# Patient Record
Sex: Female | Born: 1990 | Race: Black or African American | Hispanic: No | Marital: Single | Smoking: Former smoker
Health system: Southern US, Community
[De-identification: ages and names within clinical notes are randomized; demographics above are authoritative.]

## PROBLEM LIST (undated history)

## (undated) ENCOUNTER — Inpatient Hospital Stay (HOSPITAL_COMMUNITY): Payer: Self-pay

## (undated) DIAGNOSIS — IMO0002 Reserved for concepts with insufficient information to code with codable children: Secondary | ICD-10-CM

## (undated) DIAGNOSIS — O26899 Other specified pregnancy related conditions, unspecified trimester: Secondary | ICD-10-CM

## (undated) DIAGNOSIS — D649 Anemia, unspecified: Secondary | ICD-10-CM

## (undated) DIAGNOSIS — O0930 Supervision of pregnancy with insufficient antenatal care, unspecified trimester: Secondary | ICD-10-CM

## (undated) DIAGNOSIS — Z6791 Unspecified blood type, Rh negative: Secondary | ICD-10-CM

## (undated) HISTORY — DX: Supervision of pregnancy with insufficient antenatal care, unspecified trimester: O09.30

## (undated) HISTORY — DX: Anemia, unspecified: D64.9

## (undated) HISTORY — DX: Unspecified blood type, rh negative: Z67.91

## (undated) HISTORY — DX: Reserved for concepts with insufficient information to code with codable children: IMO0002

## (undated) HISTORY — DX: Other specified pregnancy related conditions, unspecified trimester: O26.899

---
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
## 2011-01-26 ENCOUNTER — Encounter (HOSPITAL_COMMUNITY): Payer: Self-pay | Admitting: *Deleted

## 2011-01-26 ENCOUNTER — Inpatient Hospital Stay (HOSPITAL_COMMUNITY)
Admission: AD | Admit: 2011-01-26 | Discharge: 2011-01-27 | Disposition: A | Discharge status: Home / Self Care | Payer: Medicaid Other | Source: Ambulatory Visit | Attending: Obstetrics and Gynecology | Admitting: Obstetrics and Gynecology

## 2011-01-26 DIAGNOSIS — O26899 Other specified pregnancy related conditions, unspecified trimester: Secondary | ICD-10-CM

## 2011-01-26 DIAGNOSIS — O99891 Other specified diseases and conditions complicating pregnancy: Secondary | ICD-10-CM | POA: Insufficient documentation

## 2011-01-26 DIAGNOSIS — R109 Unspecified abdominal pain: Secondary | ICD-10-CM | POA: Insufficient documentation

## 2011-01-26 LAB — URINALYSIS, ROUTINE W REFLEX MICROSCOPIC
Glucose, UA: NEGATIVE mg/dL
Hgb urine dipstick: NEGATIVE
Protein, ur: NEGATIVE mg/dL
Specific Gravity, Urine: 1.02 (ref 1.005–1.030)

## 2011-01-26 NOTE — Progress Notes (Signed)
Pt states, " I've had cramping in my low abdomen for a couple of hours."

## 2011-01-27 ENCOUNTER — Inpatient Hospital Stay (HOSPITAL_COMMUNITY): Payer: Medicaid Other

## 2011-01-27 ENCOUNTER — Encounter (HOSPITAL_COMMUNITY): Payer: Self-pay

## 2011-01-27 LAB — WET PREP, GENITAL
Trich, Wet Prep: NONE SEEN
Yeast Wet Prep HPF POC: NONE SEEN

## 2011-01-27 LAB — CBC
HCT: 32.2 % — ABNORMAL LOW (ref 36.0–46.0)
Hemoglobin: 11.6 g/dL — ABNORMAL LOW (ref 12.0–15.0)
MCV: 85.6 fL (ref 78.0–100.0)
RBC: 3.76 MIL/uL — ABNORMAL LOW (ref 3.87–5.11)
WBC: 10.4 10*3/uL (ref 4.0–10.5)

## 2011-01-27 NOTE — Progress Notes (Signed)
Written and verbal d/c instructions given and understanding voiced. 

## 2011-01-27 NOTE — Progress Notes (Signed)
Patient states that she has no prenatal care awaiting for her medicaid. She states that she started sharp lower abdominal pain today and wanted to make sure everything was okay. She denies any vaginal bleeding or discharge.

## 2011-01-27 NOTE — ED Provider Notes (Signed)
Agree with above note.  Heidi Hinton 01/27/2011 7:49 AM   

## 2011-01-27 NOTE — ED Provider Notes (Signed)
History     Chief Complaint  Patient presents with  . Abdominal Pain   HPI This is a 20 y.o. female at [redacted]w[redacted]d (17+ by LMP) who presents with C/O lower abdominal cramping for a week or so. Denies bleeding. Has not had PNC yet, waiting for Medicaid, so she wanted to make sure baby was OK   OB History    Grav Para Term Preterm Abortions TAB SAB Ect Mult Living   1               History reviewed. No pertinent past medical history.  History reviewed. No pertinent past surgical history.  History reviewed. No pertinent family history.  History  Substance Use Topics  . Smoking status: Never Smoker   . Smokeless tobacco: Not on file  . Alcohol Use: No    Allergies: No Known Allergies  Prescriptions prior to admission  Medication Sig Dispense Refill  . Prenatal Vit-Fe Fumarate-FA (PRENATAL MULTIVITAMIN) TABS Take 1 tablet by mouth daily.          ROS See above  Physical Exam   Blood pressure 135/67, pulse 69, temperature 99.1 F (37.3 C), temperature source Oral, resp. rate 16, height 5' 6.25" (1.683 m), weight 112 lb 8 oz (51.03 kg), last menstrual period 09/24/2010.  Physical Exam  Constitutional: She is oriented to person, place, and time. She appears well-developed and well-nourished. No distress.  HENT:  Head: Normocephalic.  Cardiovascular: Normal rate.   Respiratory: Effort normal.  GI: Soft. She exhibits no distension and no mass. There is no tenderness. There is no rebound and no guarding.  Genitourinary: Vagina normal and uterus normal. No vaginal discharge found.       Uterus 12-14 week size Cervix closed and long  Musculoskeletal: Normal range of motion.  Neurological: She is alert and oriented to person, place, and time.  Skin: Skin is warm and dry.  Psychiatric: She has a normal mood and affect.   Ultrasound:: IMPRESSION:  Single live intrauterine pregnancy noted, with a crown-rump length  of 5.6 cm, corresponding to a gestational age of [redacted] weeks 2  days.  This does not match the gestational age by LMP, and reflects a new  estimated date of delivery of August 08, 2011.  12.2 weeks [facility] 08/08/11 MAU Course  Procedures  MDM GC/Chlam/Wet Prep  Assessment and Plan  A:  IUP at [redacted]w[redacted]d      Normal examination P:  Encouraged to seek [facility] soon       D/c home  [facility] 01/27/2011, 2:44 AM

## 2011-01-28 LAB — GC/CHLAMYDIA PROBE AMP, GENITAL: Chlamydia, DNA Probe: NEGATIVE

## 2011-02-05 NOTE — L&D Delivery Note (Signed)
Delivery Note At 6:36 PM a viable female, "Marsell", was delivered via Vaginal, Spontaneous Delivery (Presentation: ;  ).  APGAR: 8, 9; weight .   Placenta status: Intact, Spontaneous.  Cord: 3 vessels with the following complications:  CAN x 1, reduced over vtx at delivery..  Cord pH: NA Cord blood donation collected by V. Emilee Hero, CNM. Uterus moderately boggy after delivery, with several small gushes of blood.  Cytotech 1000 placed in rectum for prophylaxis.  Anesthesia: Epidural  Episiotomy: None Lacerations: Periurethral Suture Repair: 3.0 vicryl rapide Est. Blood Loss (mL): 300 cc  Mom to postpartum.  Baby to skin to skin. Family plans outpatient circumcision.Marland Kitchen  Nigel Bridgeman 08/13/2011, 7:11 PM

## 2011-03-04 LAB — OB RESULTS CONSOLE HEPATITIS B SURFACE ANTIGEN: Hepatitis B Surface Ag: NEGATIVE

## 2011-03-04 LAB — OB RESULTS CONSOLE RUBELLA ANTIBODY, IGM: Rubella: IMMUNE

## 2011-03-04 LAB — OB RESULTS CONSOLE HIV ANTIBODY (ROUTINE TESTING): HIV: NONREACTIVE

## 2011-03-06 ENCOUNTER — Other Ambulatory Visit: Payer: Self-pay | Admitting: Obstetrics and Gynecology

## 2011-03-09 ENCOUNTER — Inpatient Hospital Stay (HOSPITAL_COMMUNITY)
Admission: AD | Admit: 2011-03-09 | Discharge: 2011-03-09 | Disposition: A | Discharge status: Home / Self Care | Payer: Medicaid Other | Source: Ambulatory Visit | Attending: Obstetrics and Gynecology | Admitting: Obstetrics and Gynecology

## 2011-03-09 DIAGNOSIS — Z3480 Encounter for supervision of other normal pregnancy, unspecified trimester: Secondary | ICD-10-CM | POA: Insufficient documentation

## 2011-03-09 DIAGNOSIS — Z2989 Encounter for other specified prophylactic measures: Secondary | ICD-10-CM | POA: Insufficient documentation

## 2011-03-09 DIAGNOSIS — Z298 Encounter for other specified prophylactic measures: Secondary | ICD-10-CM | POA: Insufficient documentation

## 2011-03-09 MED ORDER — RHO D IMMUNE GLOBULIN 1500 UNIT/2ML IJ SOLN
300.0000 ug | Freq: Once | INTRAMUSCULAR | Status: AC
  Administered 2011-03-09: 300 ug via INTRAMUSCULAR
  Filled 2011-03-09: qty 2

## 2011-03-09 NOTE — Progress Notes (Signed)
Pt told to come here for an injection.

## 2011-03-10 LAB — RH IG WORKUP (INCLUDES ABO/RH)

## 2011-03-11 DIAGNOSIS — R87612 Low grade squamous intraepithelial lesion on cytologic smear of cervix (LGSIL): Secondary | ICD-10-CM

## 2011-03-11 DIAGNOSIS — IMO0002 Reserved for concepts with insufficient information to code with codable children: Secondary | ICD-10-CM

## 2011-03-11 HISTORY — DX: Low grade squamous intraepithelial lesion on cytologic smear of cervix (LGSIL): R87.612

## 2011-03-11 HISTORY — DX: Reserved for concepts with insufficient information to code with codable children: IMO0002

## 2011-04-05 DIAGNOSIS — Z6791 Unspecified blood type, Rh negative: Secondary | ICD-10-CM

## 2011-04-05 DIAGNOSIS — O26899 Other specified pregnancy related conditions, unspecified trimester: Secondary | ICD-10-CM

## 2011-04-05 HISTORY — DX: Other specified pregnancy related conditions, unspecified trimester: O26.899

## 2011-04-05 HISTORY — DX: Unspecified blood type, rh negative: Z67.91

## 2011-04-18 ENCOUNTER — Encounter (INDEPENDENT_AMBULATORY_CARE_PROVIDER_SITE_OTHER): Payer: Medicaid Other

## 2011-04-18 DIAGNOSIS — Z331 Pregnant state, incidental: Secondary | ICD-10-CM

## 2011-05-01 ENCOUNTER — Inpatient Hospital Stay (HOSPITAL_COMMUNITY)
Admission: AD | Admit: 2011-05-01 | Discharge: 2011-05-01 | Disposition: A | Discharge status: Home / Self Care | Payer: Medicaid Other | Attending: Obstetrics and Gynecology | Admitting: Obstetrics and Gynecology

## 2011-05-01 ENCOUNTER — Inpatient Hospital Stay (HOSPITAL_COMMUNITY): Payer: Medicaid Other

## 2011-05-01 ENCOUNTER — Encounter (HOSPITAL_COMMUNITY): Payer: Self-pay | Admitting: *Deleted

## 2011-05-01 DIAGNOSIS — O4700 False labor before 37 completed weeks of gestation, unspecified trimester: Secondary | ICD-10-CM | POA: Insufficient documentation

## 2011-05-01 DIAGNOSIS — R109 Unspecified abdominal pain: Secondary | ICD-10-CM

## 2011-05-01 DIAGNOSIS — K529 Noninfective gastroenteritis and colitis, unspecified: Secondary | ICD-10-CM

## 2011-05-01 DIAGNOSIS — O99891 Other specified diseases and conditions complicating pregnancy: Secondary | ICD-10-CM | POA: Insufficient documentation

## 2011-05-01 DIAGNOSIS — Z331 Pregnant state, incidental: Secondary | ICD-10-CM

## 2011-05-01 DIAGNOSIS — K5289 Other specified noninfective gastroenteritis and colitis: Secondary | ICD-10-CM

## 2011-05-01 LAB — URINALYSIS, ROUTINE W REFLEX MICROSCOPIC
Bilirubin Urine: NEGATIVE
Hgb urine dipstick: NEGATIVE
Nitrite: NEGATIVE
Specific Gravity, Urine: <1.005 — ABNORMAL LOW (ref 1.005–1.030)
pH: 6.5 (ref 5.0–8.0)

## 2011-05-01 LAB — WET PREP, GENITAL
Trich, Wet Prep: NONE SEEN
Yeast Wet Prep HPF POC: NONE SEEN

## 2011-05-01 LAB — GC/CHLAMYDIA PROBE AMP, GENITAL: Chlamydia, DNA Probe: NEGATIVE

## 2011-05-01 MED ORDER — PROMETHAZINE HCL 25 MG RE SUPP: 25.0000 mg | Freq: Four times a day (QID) | RECTAL | Status: AC | PRN

## 2011-05-01 MED ORDER — ONDANSETRON 8 MG PO TBDP: 8.0000 mg | ORAL_TABLET | Freq: Three times a day (TID) | ORAL | Status: AC | PRN

## 2011-05-01 MED ORDER — ONDANSETRON 8 MG PO TBDP
8.0000 mg | ORAL_TABLET | Freq: Once | ORAL | Status: AC
  Administered 2011-05-01: 8 mg via ORAL
  Filled 2011-05-01: qty 1

## 2011-05-01 MED ORDER — PROMETHAZINE HCL 25 MG RE SUPP
25.0000 mg | Freq: Once | RECTAL | Status: AC
  Administered 2011-05-01: 25 mg via RECTAL
  Filled 2011-05-01: qty 1
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
## 2011-05-01 NOTE — MAU Note (Signed)
Generalized abdominal pain. No vaginal bleeding or discharge. Vomiting x1 earlier yesterday.

## 2011-05-01 NOTE — Discharge Instructions (Signed)
B.R.A.T. Diet Your doctor has recommended the B.R.A.T. diet for you or your child until the condition improves. This is often used to help control diarrhea and vomiting symptoms. If you or your child can tolerate clear liquids, you may have:  Bananas.   Rice.   Applesauce.   Toast (and other simple starches such as crackers, potatoes, noodles).  Be sure to avoid dairy products, meats, and fatty foods until symptoms are better. Fruit juices such as apple, grape, and prune juice can make diarrhea worse. Avoid these. Continue this diet for 2 days or as instructed by your caregiver. Document Released: 01/21/2005 Document Revised: 01/10/2011 Document Reviewed: 07/10/2006 [facility] Patient Information 2012 [place], [place]. Preventing Preterm Labor Preterm labor is when a pregnant woman has contractions that cause the cervix to open, shorten, and thin before 37 weeks of pregnancy. You will have regular contractions (tightening) 2 to 3 minutes apart. This usually causes discomfort or pain. HOME CARE  Eat a healthy diet.   Take your vitamins as told by your doctor.   Drink enough fluids to keep your pee (urine) clear or pale yellow every day.   Get rest and sleep.   Do not have sex if you are at high risk for preterm labor.   Follow your doctor's advice about activity, medicines, and tests.   Avoid stress.   Avoid hard labor or exercise that lasts for a long time.   Do not smoke.  GET HELP RIGHT AWAY IF:   You are having contractions.   You have belly (abdominal) pain.   You have bleeding from your vagina.   You have pain when you pee (urinate).   You have abnormal discharge from your vagina.   You have a temperature by mouth above 102 F (38.9 C).  MAKE SURE YOU:  Understand these instructions.   Will watch your condition.   Will get help if you are not doing well or get worse.  Document Released: 04/19/2008 Document Revised: 01/10/2011 Document Reviewed:  04/19/2008 [facility] Patient Information 2012 [place], [place].

## 2011-05-17 ENCOUNTER — Encounter: Payer: Medicaid Other | Admitting: Obstetrics and Gynecology

## 2011-05-17 ENCOUNTER — Other Ambulatory Visit: Payer: Medicaid Other

## 2011-05-17 ENCOUNTER — Other Ambulatory Visit: Payer: Self-pay

## 2011-05-22 DIAGNOSIS — R87612 Low grade squamous intraepithelial lesion on cytologic smear of cervix (LGSIL): Secondary | ICD-10-CM

## 2011-05-22 DIAGNOSIS — O26899 Other specified pregnancy related conditions, unspecified trimester: Secondary | ICD-10-CM | POA: Insufficient documentation

## 2011-05-22 DIAGNOSIS — O0930 Supervision of pregnancy with insufficient antenatal care, unspecified trimester: Secondary | ICD-10-CM

## 2011-05-22 DIAGNOSIS — Z6791 Unspecified blood type, Rh negative: Secondary | ICD-10-CM

## 2011-05-22 DIAGNOSIS — IMO0002 Reserved for concepts with insufficient information to code with codable children: Secondary | ICD-10-CM

## 2011-05-22 DIAGNOSIS — D649 Anemia, unspecified: Secondary | ICD-10-CM

## 2011-05-23 ENCOUNTER — Ambulatory Visit (INDEPENDENT_AMBULATORY_CARE_PROVIDER_SITE_OTHER): Payer: Medicaid Other | Admitting: Obstetrics and Gynecology

## 2011-05-23 ENCOUNTER — Encounter: Payer: Self-pay | Admitting: Obstetrics and Gynecology

## 2011-05-23 VITALS — BP 114/56 | Wt 135.0 lb

## 2011-05-23 DIAGNOSIS — Z331 Pregnant state, incidental: Secondary | ICD-10-CM

## 2011-05-23 DIAGNOSIS — N898 Other specified noninflammatory disorders of vagina: Secondary | ICD-10-CM

## 2011-05-23 DIAGNOSIS — R87612 Low grade squamous intraepithelial lesion on cytologic smear of cervix (LGSIL): Secondary | ICD-10-CM

## 2011-05-23 DIAGNOSIS — IMO0002 Reserved for concepts with insufficient information to code with codable children: Secondary | ICD-10-CM

## 2011-05-23 LAB — POCT WET PREP (WET MOUNT)
Clue Cells Wet Prep Whiff POC: POSITIVE
pH: 5.5

## 2011-05-23 LAB — HEMOGLOBIN: Hemoglobin: 10.7 g/dL — ABNORMAL LOW (ref 12.0–15.0)

## 2011-05-23 MED ORDER — METRONIDAZOLE 500 MG PO TABS: 500.0000 mg | ORAL_TABLET | Freq: Two times a day (BID) | ORAL | Status: AC

## 2011-05-23 NOTE — Progress Notes (Addendum)
No complaints Wet  Prep done Colpo done +BV - Flagyl S<D - sched u/s at [facility] repeat pap PP

## 2011-05-23 NOTE — Progress Notes (Addendum)
  HPI  Review of Systems Review of Systems  Blood pressure 114/56, weight 135 lb (61.236 kg), last menstrual period 09/24/2010.  Physical Exam Physical Exam  Genitourinary:       Assessment    Procedure Details  The risks and benefits of the procedure and Written informed consent obtained.  Speculum placed in vagina and excellent visualization of cervix achieved, cervix swabbed x 3 with acetic acid solution.  Specimens: none  Complications: none.     Plan    Specimens labelled and sent to Pathology. Return to discuss Pathology results in 2 weeks.      Purcell Nails 05/23/2011, 5:03 PM

## 2011-05-23 NOTE — Progress Notes (Signed)
Addended by: Osborn Coho on: 05/23/2011 06:14 PM   Modules accepted: Orders

## 2011-05-24 LAB — GLUCOSE TOLERANCE, 1 HOUR: Glucose, 1 Hour GTT: 45 mg/dL — ABNORMAL LOW (ref 70–140)

## 2011-05-30 ENCOUNTER — Encounter: Payer: Self-pay | Admitting: Obstetrics and Gynecology

## 2011-05-30 ENCOUNTER — Ambulatory Visit (INDEPENDENT_AMBULATORY_CARE_PROVIDER_SITE_OTHER): Payer: Medicaid Other | Admitting: Obstetrics and Gynecology

## 2011-05-30 ENCOUNTER — Ambulatory Visit (INDEPENDENT_AMBULATORY_CARE_PROVIDER_SITE_OTHER): Payer: Medicaid Other

## 2011-05-30 VITALS — BP 118/44 | Wt 137.0 lb

## 2011-05-30 DIAGNOSIS — Z331 Pregnant state, incidental: Secondary | ICD-10-CM

## 2011-05-30 NOTE — Progress Notes (Signed)
Doing well. [place]: single gestation, 30 weeks and 4 days (55th percentile), vertex, normal fluid, cervix is 3.24 cm.  Abdominal circumference lags behind by one week. Glucola equals 45.  Syphilis nonreactive.  Hemoglobin 10.7. Repeat ultrasound in 3 weeks. 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  Dr. Stefano Gaul

## 2011-06-03 ENCOUNTER — Encounter: Payer: Medicaid Other | Admitting: Obstetrics and Gynecology

## 2011-06-06 ENCOUNTER — Encounter: Payer: Medicaid Other | Admitting: Obstetrics and Gynecology

## 2011-06-18 ENCOUNTER — Ambulatory Visit (INDEPENDENT_AMBULATORY_CARE_PROVIDER_SITE_OTHER): Payer: Medicaid Other | Admitting: Obstetrics and Gynecology

## 2011-06-18 VITALS — BP 116/60 | Wt 141.0 lb

## 2011-06-18 DIAGNOSIS — Z331 Pregnant state, incidental: Secondary | ICD-10-CM

## 2011-06-18 NOTE — Progress Notes (Signed)
Pt states her ribs have been sore x 3 days.

## 2011-06-18 NOTE — Progress Notes (Signed)
Doing well.  Return office in 2 weeks. Ultrasound next visit because of a lagging abdominal circumference.  Dr. Stefano Gaul

## 2011-06-22 ENCOUNTER — Inpatient Hospital Stay (HOSPITAL_COMMUNITY)
Admission: AD | Admit: 2011-06-22 | Discharge: 2011-06-22 | Disposition: A | Discharge status: Home / Self Care | Payer: Medicaid Other | Source: Ambulatory Visit | Attending: Obstetrics and Gynecology | Admitting: Obstetrics and Gynecology

## 2011-06-22 ENCOUNTER — Encounter (HOSPITAL_COMMUNITY): Payer: Self-pay | Admitting: *Deleted

## 2011-06-22 DIAGNOSIS — O99891 Other specified diseases and conditions complicating pregnancy: Secondary | ICD-10-CM | POA: Insufficient documentation

## 2011-06-22 DIAGNOSIS — D6959 Other secondary thrombocytopenia: Secondary | ICD-10-CM | POA: Diagnosis present

## 2011-06-22 DIAGNOSIS — R079 Chest pain, unspecified: Secondary | ICD-10-CM | POA: Insufficient documentation

## 2011-06-22 DIAGNOSIS — J069 Acute upper respiratory infection, unspecified: Secondary | ICD-10-CM | POA: Diagnosis present

## 2011-06-22 DIAGNOSIS — O4700 False labor before 37 completed weeks of gestation, unspecified trimester: Secondary | ICD-10-CM | POA: Insufficient documentation

## 2011-06-22 LAB — DIFFERENTIAL
Basophils Absolute: 0 10*3/uL (ref 0.0–0.1)
Basophils Relative: 0 % (ref 0–1)
Eosinophils Absolute: 0.1 10*3/uL (ref 0.0–0.7)
Monocytes Absolute: 1 10*3/uL (ref 0.1–1.0)
Neutro Abs: 9.4 10*3/uL — ABNORMAL HIGH (ref 1.7–7.7)
Neutrophils Relative %: 79 % — ABNORMAL HIGH (ref 43–77)

## 2011-06-22 LAB — CBC
HCT: 31.1 % — ABNORMAL LOW (ref 36.0–46.0)
Hemoglobin: 11.2 g/dL — ABNORMAL LOW (ref 12.0–15.0)
MCH: 31.8 pg (ref 26.0–34.0)
MCHC: 36 g/dL (ref 30.0–36.0)
RDW: 13.5 % (ref 11.5–15.5)

## 2011-06-22 LAB — WET PREP, GENITAL
Clue Cells Wet Prep HPF POC: NONE SEEN
Trich, Wet Prep: NONE SEEN
Yeast Wet Prep HPF POC: NONE SEEN

## 2011-06-22 LAB — URINALYSIS, ROUTINE W REFLEX MICROSCOPIC
Bilirubin Urine: NEGATIVE
Hgb urine dipstick: NEGATIVE
Ketones, ur: 40 mg/dL — AB
Protein, ur: NEGATIVE mg/dL
Urobilinogen, UA: 0.2 mg/dL (ref 0.0–1.0)

## 2011-06-22 MED ORDER — GUAIFENESIN-CODEINE 100-10 MG/5ML PO SOLN
10.0000 mL | ORAL | Status: AC
  Administered 2011-06-22: 10 mL via ORAL
  Filled 2011-06-22: qty 10

## 2011-06-22 MED ORDER — ALBUTEROL SULFATE (5 MG/ML) 0.5% IN NEBU
INHALATION_SOLUTION | RESPIRATORY_TRACT | Status: AC
  Filled 2011-06-22: qty 0.5

## 2011-06-22 MED ORDER — ALBUTEROL SULFATE (5 MG/ML) 0.5% IN NEBU
2.5000 mg | INHALATION_SOLUTION | RESPIRATORY_TRACT | Status: AC
  Administered 2011-06-22: 22:00:00 via RESPIRATORY_TRACT

## 2011-06-22 MED ORDER — GUAIFENESIN-CODEINE 100-10 MG/5ML PO SYRP: 5.0000 mL | ORAL_SOLUTION | Freq: Three times a day (TID) | ORAL | Status: AC | PRN

## 2011-06-22 MED ORDER — RHO D IMMUNE GLOBULIN 1500 UNIT/2ML IJ SOLN
300.0000 ug | Freq: Once | INTRAMUSCULAR | Status: AC
  Administered 2011-06-22: 300 ug via INTRAMUSCULAR

## 2011-06-22 MED ORDER — AZITHROMYCIN 250 MG PO TABS: ORAL_TABLET | ORAL | Status: AC

## 2011-06-22 MED ORDER — AZITHROMYCIN 250 MG PO TABS
500.0000 mg | ORAL_TABLET | ORAL | Status: AC
  Administered 2011-06-22: 500 mg via ORAL
  Filled 2011-06-22: qty 2

## 2011-06-22 MED ORDER — IPRATROPIUM BROMIDE 0.02 % IN SOLN
RESPIRATORY_TRACT | Status: AC
  Filled 2011-06-22: qty 2.5

## 2011-06-22 MED ORDER — ALBUTEROL SULFATE HFA 108 (90 BASE) MCG/ACT IN AERS
1.0000 | INHALATION_SPRAY | RESPIRATORY_TRACT | Status: DC | PRN
  Administered 2011-06-22: 1 via RESPIRATORY_TRACT
  Filled 2011-06-22 (×2): qty 6.7

## 2011-06-22 MED ORDER — IPRATROPIUM BROMIDE 0.02 % IN SOLN
0.5000 mg | RESPIRATORY_TRACT | Status: AC
  Administered 2011-06-22: 22:00:00 via RESPIRATORY_TRACT

## 2011-06-22 NOTE — MAU Note (Signed)
Pt states feels better and is breathing better. Take home inhaler in to pt and use explained and understanding voiced

## 2011-06-22 NOTE — Progress Notes (Signed)
Heidi Hinton CNM in to. GBS also obtained.

## 2011-06-22 NOTE — Progress Notes (Signed)
Written and verbal d/c instructions given and understanding voiced. 

## 2011-06-22 NOTE — MAU Note (Signed)
Respiratory at bedside for breathing tx

## 2011-06-22 NOTE — MAU Provider Note (Signed)
History   21 yo G1P0 at 75 2/7 weeks presented unannounced c/o chest tightness, wheezing, productive cough, and nasal congestion x 24 hours.  Best friend had cold and cough, and patient was exposed to her over the last 24 hours.  Patient denies fever, dysuria, hemoptysis, or other symptoms.  Also reports she has not received her 28 week Rhophylac (received it in early pregnancy for 1st trimester bleeding).  Does report occasional cramping since she has had cough, denies leaking, bleeding, and reports +FM.  Pregnancy remarkable for: Late to care Anemia Rh negative LGSIL on pap AC growth lag--being followed by [place] at [facility].   Chief Complaint  Patient presents with  . Abdominal Pain  . Pleurisy    OB History    Grav Para Term Preterm Abortions TAB SAB Ect Mult Living   1               Past Medical History  Diagnosis Date  . Anemia   . Rh negative status during pregnancy 04/2011  . 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 Wisdom tooth extraction 2009    Family History  Problem Relation Age of Onset  . Alcohol abuse Mother   . Asthma Brother   . Cancer Brother   . Mental illness Maternal Grandmother   . Cancer Maternal Grandmother   . Anesthesia problems Neg Hx   . Hypotension Neg Hx   . Malignant hyperthermia Neg Hx   . Pseudochol deficiency Neg Hx     History  Substance Use Topics  . Smoking status: Never Smoker   . Smokeless tobacco: Never Used  . Alcohol Use: No    Allergies: No Known Allergies  Prescriptions prior to admission  Medication Sig Dispense Refill  . acetaminophen (TYLENOL) 325 MG tablet Take 650 mg by mouth every 6 (six) hours as needed. pain      . FeFum-FePoly-FA-B Cmp-C-Biot (INTEGRA PLUS) CAPS Take by mouth daily.      . Prenatal Vit-Fe Fumarate-FA (PRENATAL MULTIVITAMIN) TABS Take 1 tablet by mouth every morning.          Physical Exam   Blood pressure 134/79,  pulse 87, temperature 98.5 F (36.9 C), temperature source Oral, resp. rate 20, height 5\' 7"  (1.702 m), weight 141 lb (63.957 kg), last menstrual period 09/24/2010.  In NAD, but audible wheezing Chest inspiratory and expiratory wheezes in upper lobes.  Clear phlegm with coughing. Heart RRR without murmur Abd gravid, NT Pelvic--deferred at present Ext WNL Negative CVAT  FHR reactive, no decels Sporadic mild contractions, generally associated with coughing episodes  ED Course  IUP at 33 2/7 weeks URI, with reactive airway changes Rh negative, has not had Rhophylac Uterine activity  Plan: Breathing treatment Initiate Zpak, cough syrup--1st doses now. Check CBC, diff, Rhophylac w/u and injection. Albuterol inhaler at discharge.  Nigel Bridgeman, CNM, [place] 06/22/11 9:45p   Addendum: Doing much better--no SOB, wheezing resolved.  Patient has coughed up a large amount of yellowish sputum. Sporadic mild contractions, patient unaware of these. FHR reactive, no decels.  Pelvic--cervix closed, long, vtx -2 Small amount white d/c in vault. GC, chlamydia, GBS, wet prep done. Awaiting Rhophylac, CBC/diff results.  Plan: D/C home after Rhophylac, labs returned. Rx completion of Zpak, Robitussin AC. Home with albuterol inhaler and instructions. Push po fluids, and rest. S/S PTL reviewed. Keep scheduled appointment at [facility] 5/28 or call prn.  Nigel Bridgeman, CNM,  [place] 06/22/11  11p

## 2011-06-22 NOTE — MAU Note (Signed)
Pt up to BR. Ok to d/c efm per V. Emilee Hero, CNM.

## 2011-06-22 NOTE — Discharge Instructions (Signed)

## 2011-06-22 NOTE — MAU Note (Signed)
Awoke our of sleep at 0400 with chest congestion and trying to catch my breath. Was seen by Dr Blima Rich with sore ribs. Took damp cloth, warmed in microwave and put on chest to break congestion but didn't help. At 1000 was wheezing some. Fan blowing on me helped. Chest tight. Hurts to cough. Coughs up clear mucous.

## 2011-06-23 LAB — RH IG WORKUP (INCLUDES ABO/RH): Antibody Screen: NEGATIVE

## 2011-06-27 ENCOUNTER — Encounter: Payer: Self-pay | Admitting: Obstetrics and Gynecology

## 2011-06-27 DIAGNOSIS — O99820 Streptococcus B carrier state complicating pregnancy: Secondary | ICD-10-CM | POA: Insufficient documentation

## 2011-06-29 LAB — CULTURE, BETA STREP (GROUP B ONLY)

## 2011-07-02 ENCOUNTER — Encounter: Payer: Medicaid Other | Admitting: Obstetrics and Gynecology

## 2011-07-02 ENCOUNTER — Ambulatory Visit (INDEPENDENT_AMBULATORY_CARE_PROVIDER_SITE_OTHER): Payer: Medicaid Other

## 2011-07-02 ENCOUNTER — Ambulatory Visit (INDEPENDENT_AMBULATORY_CARE_PROVIDER_SITE_OTHER): Payer: Medicaid Other | Admitting: Obstetrics and Gynecology

## 2011-07-02 VITALS — BP 118/56 | Wt 144.0 lb

## 2011-07-02 DIAGNOSIS — O26879 Cervical shortening, unspecified trimester: Secondary | ICD-10-CM

## 2011-07-02 DIAGNOSIS — Z331 Pregnant state, incidental: Secondary | ICD-10-CM

## 2011-07-02 DIAGNOSIS — O26849 Uterine size-date discrepancy, unspecified trimester: Secondary | ICD-10-CM

## 2011-07-02 LAB — US OB FOLLOW UP

## 2011-07-02 NOTE — Progress Notes (Signed)
RTO 2 weeks. Ultrasound: Single gestation, normal fluid, vertex, normal anatomy, cervix 3. To 2 cm, 34 weeks and 3 days (36 percentile), abdominal circumference 46 percentile. Doing well.

## 2011-07-17 ENCOUNTER — Ambulatory Visit (INDEPENDENT_AMBULATORY_CARE_PROVIDER_SITE_OTHER): Payer: Medicaid Other | Admitting: Obstetrics and Gynecology

## 2011-07-17 VITALS — BP 106/72 | Wt 147.0 lb

## 2011-07-17 DIAGNOSIS — Z331 Pregnant state, incidental: Secondary | ICD-10-CM

## 2011-07-17 NOTE — Progress Notes (Signed)
Pt states she has no concerns. GBS today. Pt states she cannot give a urine sample at this time. Will need to leave one before she leaves. Pt desires cervix check today.  [facility] CMA

## 2011-07-17 NOTE — Progress Notes (Signed)
Beta strep, GC, Chlamydia sent. Return office in one week. AVS

## 2011-07-18 LAB — GC/CHLAMYDIA PROBE AMP, GENITAL
Chlamydia, DNA Probe: NEGATIVE
GC Probe Amp, Genital: NEGATIVE

## 2011-07-19 LAB — STREP B DNA PROBE: GBSP: POSITIVE

## 2011-07-24 ENCOUNTER — Encounter (HOSPITAL_COMMUNITY): Payer: Self-pay

## 2011-07-24 ENCOUNTER — Other Ambulatory Visit: Payer: Medicaid Other

## 2011-07-24 ENCOUNTER — Encounter: Payer: Medicaid Other | Admitting: Obstetrics and Gynecology

## 2011-07-24 ENCOUNTER — Inpatient Hospital Stay (HOSPITAL_COMMUNITY): Payer: Medicaid Other

## 2011-07-24 ENCOUNTER — Inpatient Hospital Stay (HOSPITAL_COMMUNITY)
Admission: AD | Admit: 2011-07-24 | Discharge: 2011-07-24 | Disposition: A | Discharge status: Home / Self Care | Payer: Medicaid Other | Source: Ambulatory Visit | Attending: Obstetrics and Gynecology | Admitting: Obstetrics and Gynecology

## 2011-07-24 DIAGNOSIS — O99820 Streptococcus B carrier state complicating pregnancy: Secondary | ICD-10-CM

## 2011-07-24 DIAGNOSIS — Z91199 Patient's noncompliance with other medical treatment and regimen due to unspecified reason: Secondary | ICD-10-CM

## 2011-07-24 DIAGNOSIS — D696 Thrombocytopenia, unspecified: Secondary | ICD-10-CM

## 2011-07-24 DIAGNOSIS — Z9119 Patient's noncompliance with other medical treatment and regimen: Secondary | ICD-10-CM

## 2011-07-24 DIAGNOSIS — J069 Acute upper respiratory infection, unspecified: Secondary | ICD-10-CM

## 2011-07-24 DIAGNOSIS — O99891 Other specified diseases and conditions complicating pregnancy: Secondary | ICD-10-CM | POA: Insufficient documentation
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
## 2011-07-24 NOTE — Progress Notes (Signed)
Heidi Hinton CNM NOTIFIED OF PATIENT INTENT TO LEAVE AGAINST MEDICAL ADVICE. PATIENT DOES DENY ANY PAIN, REPORTS GOOD FETAL MOVEMENT. PATIENT STATES,"I CAN'T STAY UP HERE FOR 4 HOURS, I FEEL FINE AND MY BABY IS MOVING A LOT"

## 2011-07-26 ENCOUNTER — Telehealth: Payer: Self-pay | Admitting: Obstetrics and Gynecology

## 2011-07-26 NOTE — Telephone Encounter (Signed)
PER JAMIE, AWAITING WORD BACK FROM THE MD'S ABOUT APPTS, WAS TOLD TO SEND THIS MSG BACK TO YOU UNTIL SHE GETS AN ANSWER ABOUT WHERE TO SCHED THIS PT.

## 2011-07-30 ENCOUNTER — Ambulatory Visit (INDEPENDENT_AMBULATORY_CARE_PROVIDER_SITE_OTHER): Payer: Medicaid Other | Admitting: Obstetrics and Gynecology

## 2011-07-30 VITALS — BP 108/70 | Wt 151.0 lb

## 2011-07-30 DIAGNOSIS — O26849 Uterine size-date discrepancy, unspecified trimester: Secondary | ICD-10-CM

## 2011-07-30 DIAGNOSIS — Z331 Pregnant state, incidental: Secondary | ICD-10-CM

## 2011-07-30 NOTE — Progress Notes (Signed)
Positive beta strep.  Treatment during labor discussed. Ultrasound next visit because of size less than dates. Return office in one week. Dr. Stefano Gaul
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
## 2011-08-06 ENCOUNTER — Other Ambulatory Visit: Payer: Self-pay | Admitting: Obstetrics and Gynecology

## 2011-08-06 DIAGNOSIS — IMO0002 Reserved for concepts with insufficient information to code with codable children: Secondary | ICD-10-CM

## 2011-08-07 ENCOUNTER — Ambulatory Visit (INDEPENDENT_AMBULATORY_CARE_PROVIDER_SITE_OTHER): Payer: Medicaid Other | Admitting: Obstetrics and Gynecology

## 2011-08-07 ENCOUNTER — Other Ambulatory Visit: Payer: Self-pay | Admitting: Obstetrics and Gynecology

## 2011-08-07 ENCOUNTER — Ambulatory Visit (INDEPENDENT_AMBULATORY_CARE_PROVIDER_SITE_OTHER): Payer: Medicaid Other

## 2011-08-07 ENCOUNTER — Encounter: Payer: Self-pay | Admitting: Obstetrics and Gynecology

## 2011-08-07 DIAGNOSIS — O26849 Uterine size-date discrepancy, unspecified trimester: Secondary | ICD-10-CM

## 2011-08-07 DIAGNOSIS — IMO0002 Reserved for concepts with insufficient information to code with codable children: Secondary | ICD-10-CM

## 2011-08-07 NOTE — Progress Notes (Signed)
Pt c/o contractions about 10 min apart last night. Hasn't had any this morning. Request cervix check.

## 2011-08-07 NOTE — Progress Notes (Signed)
Induction discussed.  Risk and benefits reviewed. Ultrasound today for size less than dates. Ultrasound: Single gestation, vertex, normal fluid, 7 lbs. 4 oz. (27th percentile) Return to office 1 week. Dr. Stefano Gaul

## 2011-08-09 ENCOUNTER — Telehealth (HOSPITAL_COMMUNITY): Payer: Self-pay | Admitting: *Deleted

## 2011-08-09 ENCOUNTER — Encounter (HOSPITAL_COMMUNITY): Payer: Self-pay | Admitting: *Deleted

## 2011-08-09 NOTE — Telephone Encounter (Signed)
Preadmission screen  

## 2011-08-12 ENCOUNTER — Telehealth: Payer: Self-pay | Admitting: Obstetrics and Gynecology

## 2011-08-12 ENCOUNTER — Ambulatory Visit (INDEPENDENT_AMBULATORY_CARE_PROVIDER_SITE_OTHER): Payer: Self-pay | Admitting: Obstetrics and Gynecology

## 2011-08-12 ENCOUNTER — Encounter: Payer: Medicaid Other | Admitting: Obstetrics and Gynecology

## 2011-08-12 ENCOUNTER — Encounter: Payer: Self-pay | Admitting: Obstetrics and Gynecology

## 2011-08-12 VITALS — BP 120/82 | Wt 155.0 lb

## 2011-08-12 DIAGNOSIS — Z3490 Encounter for supervision of normal pregnancy, unspecified, unspecified trimester: Secondary | ICD-10-CM

## 2011-08-12 DIAGNOSIS — Z331 Pregnant state, incidental: Secondary | ICD-10-CM

## 2011-08-12 LAB — US OB FOLLOW UP

## 2011-08-12 NOTE — Telephone Encounter (Signed)
Pt called complaining of persistent contractions every 10 min since 2:00 am with pain and pressure. Pt was given ov @ 10:15 today with CNM Denise. Quinn Axe

## 2011-08-12 NOTE — Progress Notes (Signed)
GA: [redacted]w[redacted]d,G1P0, had been c/o CTX all night and had been walking around to relieve them. Irreg CTX. SVE: 2/50/-2 posterior, Medium, no labor. Labor precautions given. To keep scheduled appt 08/15/11

## 2011-08-12 NOTE — Progress Notes (Signed)
Contractions since last night 10 mins apart expelling mucous plug

## 2011-08-13 ENCOUNTER — Inpatient Hospital Stay (HOSPITAL_COMMUNITY)
Admission: AD | Admit: 2011-08-13 | Discharge: 2011-08-15 | DRG: 775 | Disposition: A | Discharge status: Home / Self Care | Payer: Medicaid Other | Source: Ambulatory Visit | Attending: Obstetrics and Gynecology | Admitting: Obstetrics and Gynecology

## 2011-08-13 ENCOUNTER — Encounter (HOSPITAL_COMMUNITY): Payer: Self-pay | Admitting: Anesthesiology

## 2011-08-13 ENCOUNTER — Encounter (HOSPITAL_COMMUNITY): Payer: Self-pay | Admitting: *Deleted

## 2011-08-13 ENCOUNTER — Inpatient Hospital Stay (HOSPITAL_COMMUNITY): Payer: Medicaid Other | Admitting: Anesthesiology

## 2011-08-13 DIAGNOSIS — O99892 Other specified diseases and conditions complicating childbirth: Secondary | ICD-10-CM | POA: Diagnosis present

## 2011-08-13 DIAGNOSIS — Z22330 Carrier of Group B streptococcus: Secondary | ICD-10-CM

## 2011-08-13 DIAGNOSIS — IMO0001 Reserved for inherently not codable concepts without codable children: Secondary | ICD-10-CM | POA: Insufficient documentation

## 2011-08-13 LAB — URINALYSIS, ROUTINE W REFLEX MICROSCOPIC
Bilirubin Urine: NEGATIVE
Nitrite: NEGATIVE
Specific Gravity, Urine: 1.01 (ref 1.005–1.030)
Urobilinogen, UA: 0.2 mg/dL (ref 0.0–1.0)

## 2011-08-13 LAB — COMPREHENSIVE METABOLIC PANEL
Albumin: 3.2 g/dL — ABNORMAL LOW (ref 3.5–5.2)
BUN: 5 mg/dL — ABNORMAL LOW (ref 6–23)
Calcium: 9.2 mg/dL (ref 8.4–10.5)
Creatinine, Ser: 0.66 mg/dL (ref 0.50–1.10)
Total Bilirubin: 0.4 mg/dL (ref 0.3–1.2)
Total Protein: 6.4 g/dL (ref 6.0–8.3)

## 2011-08-13 LAB — CBC
MCH: 31.1 pg (ref 26.0–34.0)
MCHC: 35 g/dL (ref 30.0–36.0)
Platelets: 157 10*3/uL (ref 150–400)
RBC: 3.95 MIL/uL (ref 3.87–5.11)

## 2011-08-13 LAB — URINE MICROSCOPIC-ADD ON

## 2011-08-13 LAB — LACTATE DEHYDROGENASE: LDH: 224 U/L (ref 94–250)

## 2011-08-13 LAB — URIC ACID: Uric Acid, Serum: 6.3 mg/dL (ref 2.4–7.0)

## 2011-08-13 LAB — RPR: RPR Ser Ql: NONREACTIVE

## 2011-08-13 MED ORDER — LANOLIN HYDROUS EX OINT: TOPICAL_OINTMENT | CUTANEOUS | Status: DC | PRN

## 2011-08-13 MED ORDER — PENICILLIN G POTASSIUM 5000000 UNITS IJ SOLR
2.5000 10*6.[IU] | INTRAVENOUS | Status: DC
  Administered 2011-08-13 (×2): 2.5 10*6.[IU] via INTRAVENOUS
  Filled 2011-08-13 (×5): qty 2.5

## 2011-08-13 MED ORDER — EPHEDRINE 5 MG/ML INJ: 10.0000 mg | INTRAVENOUS | Status: DC | PRN

## 2011-08-13 MED ORDER — OXYCODONE-ACETAMINOPHEN 5-325 MG PO TABS
1.0000 | ORAL_TABLET | ORAL | Status: DC | PRN
  Administered 2011-08-14: 2 via ORAL
  Administered 2011-08-14: 1 via ORAL
  Administered 2011-08-15 (×2): 2 via ORAL
  Filled 2011-08-13 (×3): qty 2
  Filled 2011-08-13: qty 1

## 2011-08-13 MED ORDER — DIPHENHYDRAMINE HCL 50 MG/ML IJ SOLN: 12.5000 mg | INTRAMUSCULAR | Status: DC | PRN

## 2011-08-13 MED ORDER — PHENYLEPHRINE 40 MCG/ML (10ML) SYRINGE FOR IV PUSH (FOR BLOOD PRESSURE SUPPORT): 80.0000 ug | PREFILLED_SYRINGE | INTRAVENOUS | Status: DC | PRN

## 2011-08-13 MED ORDER — LACTATED RINGERS IV SOLN
INTRAVENOUS | Status: DC
  Administered 2011-08-13 (×4): via INTRAVENOUS

## 2011-08-13 MED ORDER — IBUPROFEN 600 MG PO TABS
600.0000 mg | ORAL_TABLET | Freq: Four times a day (QID) | ORAL | Status: DC | PRN
  Administered 2011-08-13: 600 mg via ORAL
  Filled 2011-08-13: qty 1

## 2011-08-13 MED ORDER — SIMETHICONE 80 MG PO CHEW: 80.0000 mg | CHEWABLE_TABLET | ORAL | Status: DC | PRN

## 2011-08-13 MED ORDER — DIPHENHYDRAMINE HCL 25 MG PO CAPS: 25.0000 mg | ORAL_CAPSULE | Freq: Four times a day (QID) | ORAL | Status: DC | PRN

## 2011-08-13 MED ORDER — PENICILLIN G POTASSIUM 5000000 UNITS IJ SOLR
5.0000 10*6.[IU] | Freq: Once | INTRAVENOUS | Status: AC
  Administered 2011-08-13: 5 10*6.[IU] via INTRAVENOUS
  Filled 2011-08-13: qty 5

## 2011-08-13 MED ORDER — OXYTOCIN BOLUS FROM INFUSION
250.0000 mL | Freq: Once | INTRAVENOUS | Status: DC
  Filled 2011-08-13: qty 500

## 2011-08-13 MED ORDER — IBUPROFEN 600 MG PO TABS
600.0000 mg | ORAL_TABLET | Freq: Four times a day (QID) | ORAL | Status: DC
  Administered 2011-08-14 – 2011-08-15 (×7): 600 mg via ORAL
  Filled 2011-08-13 (×7): qty 1

## 2011-08-13 MED ORDER — BENZOCAINE-MENTHOL 20-0.5 % EX AERO
1.0000 "application " | INHALATION_SPRAY | CUTANEOUS | Status: DC | PRN
  Administered 2011-08-13: 1 via TOPICAL
  Filled 2011-08-13: qty 56

## 2011-08-13 MED ORDER — EPHEDRINE 5 MG/ML INJ
10.0000 mg | INTRAVENOUS | Status: DC | PRN
  Filled 2011-08-13: qty 4

## 2011-08-13 MED ORDER — DIBUCAINE 1 % RE OINT: 1.0000 "application " | TOPICAL_OINTMENT | RECTAL | Status: DC | PRN

## 2011-08-13 MED ORDER — MISOPROSTOL 200 MCG PO TABS
ORAL_TABLET | ORAL | Status: AC
  Administered 2011-08-13: 1000 ug via RECTAL
  Filled 2011-08-13: qty 5

## 2011-08-13 MED ORDER — ONDANSETRON HCL 4 MG PO TABS: 4.0000 mg | ORAL_TABLET | ORAL | Status: DC | PRN

## 2011-08-13 MED ORDER — PRENATAL MULTIVITAMIN CH
1.0000 | ORAL_TABLET | Freq: Every day | ORAL | Status: DC
  Administered 2011-08-14 – 2011-08-15 (×2): 1 via ORAL
  Filled 2011-08-13 (×2): qty 1

## 2011-08-13 MED ORDER — SENNOSIDES-DOCUSATE SODIUM 8.6-50 MG PO TABS
2.0000 | ORAL_TABLET | Freq: Every day | ORAL | Status: DC
  Administered 2011-08-13 – 2011-08-14 (×2): 2 via ORAL

## 2011-08-13 MED ORDER — FLEET ENEMA 7-19 GM/118ML RE ENEM: 1.0000 | ENEMA | RECTAL | Status: DC | PRN

## 2011-08-13 MED ORDER — PHENYLEPHRINE 40 MCG/ML (10ML) SYRINGE FOR IV PUSH (FOR BLOOD PRESSURE SUPPORT)
80.0000 ug | PREFILLED_SYRINGE | INTRAVENOUS | Status: DC | PRN
  Filled 2011-08-13: qty 5

## 2011-08-13 MED ORDER — LACTATED RINGERS IV SOLN
INTRAVENOUS | Status: DC
  Administered 2011-08-13: 14:00:00 via INTRAUTERINE

## 2011-08-13 MED ORDER — TERBUTALINE SULFATE 1 MG/ML IJ SOLN
INTRAMUSCULAR | Status: AC
  Administered 2011-08-13: 0.25 mg via SUBCUTANEOUS
  Filled 2011-08-13: qty 1

## 2011-08-13 MED ORDER — LACTATED RINGERS IV SOLN
500.0000 mL | INTRAVENOUS | Status: DC | PRN
  Administered 2011-08-13 (×2): 500 mL via INTRAVENOUS

## 2011-08-13 MED ORDER — ONDANSETRON HCL 4 MG/2ML IJ SOLN
4.0000 mg | Freq: Four times a day (QID) | INTRAMUSCULAR | Status: DC | PRN
  Administered 2011-08-13: 4 mg via INTRAVENOUS
  Filled 2011-08-13: qty 2

## 2011-08-13 MED ORDER — WITCH HAZEL-GLYCERIN EX PADS: 1.0000 "application " | MEDICATED_PAD | CUTANEOUS | Status: DC | PRN

## 2011-08-13 MED ORDER — ACETAMINOPHEN 325 MG PO TABS: 650.0000 mg | ORAL_TABLET | ORAL | Status: DC | PRN

## 2011-08-13 MED ORDER — ZOLPIDEM TARTRATE 5 MG PO TABS: 5.0000 mg | ORAL_TABLET | Freq: Every evening | ORAL | Status: DC | PRN

## 2011-08-13 MED ORDER — LIDOCAINE HCL (PF) 1 % IJ SOLN
30.0000 mL | INTRAMUSCULAR | Status: DC | PRN
  Filled 2011-08-13: qty 30

## 2011-08-13 MED ORDER — TETANUS-DIPHTH-ACELL PERTUSSIS 5-2.5-18.5 LF-MCG/0.5 IM SUSP: 0.5000 mL | Freq: Once | INTRAMUSCULAR | Status: DC

## 2011-08-13 MED ORDER — OXYCODONE-ACETAMINOPHEN 5-325 MG PO TABS: 1.0000 | ORAL_TABLET | ORAL | Status: DC | PRN

## 2011-08-13 MED ORDER — LACTATED RINGERS IV SOLN
500.0000 mL | Freq: Once | INTRAVENOUS | Status: AC
  Administered 2011-08-13: 500 mL via INTRAVENOUS

## 2011-08-13 MED ORDER — FENTANYL 2.5 MCG/ML BUPIVACAINE 1/10 % EPIDURAL INFUSION (WH - ANES)
14.0000 mL/h | INTRAMUSCULAR | Status: DC
  Administered 2011-08-13 (×2): 14 mL/h via EPIDURAL
  Filled 2011-08-13 (×3): qty 60

## 2011-08-13 MED ORDER — ONDANSETRON HCL 4 MG/2ML IJ SOLN: 4.0000 mg | INTRAMUSCULAR | Status: DC | PRN

## 2011-08-13 MED ORDER — CITRIC ACID-SODIUM CITRATE 334-500 MG/5ML PO SOLN: 30.0000 mL | ORAL | Status: DC | PRN

## 2011-08-13 MED ORDER — OXYTOCIN 40 UNITS IN LACTATED RINGERS INFUSION - SIMPLE MED
62.5000 mL/h | Freq: Once | INTRAVENOUS | Status: AC
  Administered 2011-08-13: 62.5 mL/h via INTRAVENOUS
  Filled 2011-08-13: qty 1000

## 2011-08-13 MED ORDER — FENTANYL 2.5 MCG/ML BUPIVACAINE 1/10 % EPIDURAL INFUSION (WH - ANES)
INTRAMUSCULAR | Status: DC | PRN
  Administered 2011-08-13: 14 mL/h via EPIDURAL

## 2011-08-13 MED ORDER — SODIUM BICARBONATE 8.4 % IV SOLN
INTRAVENOUS | Status: DC | PRN
  Administered 2011-08-13: 4 mL via EPIDURAL

## 2011-08-13 NOTE — Progress Notes (Signed)
  Subjective: Comfortable with epidural.  FOB and patient's mother visiting.  Objective: BP 125/70  Pulse 64  Temp 98.3 F (36.8 C) (Oral)  Resp 18  Ht 5\' 8"  (1.727 m)  Wt 152 lb (68.947 kg)  BMI 23.11 kg/m2  SpO2 99%  LMP 09/24/2010      FHT:  Category 1, some early decels UC:   regular, every 6 minutes, 2 min duration of contractions SVE:   Dilation: 5 Effacement (%): 80 Station: -1 Exam by:: Manfred Arch CNM AROM--moderate MSF noted. IUPC placed.  Labs: Lab Results  Component Value Date   WBC 13.2* 08/13/2011   HGB 12.3 08/13/2011   HCT 35.1* 08/13/2011   MCV 88.9 08/13/2011   PLT 157 08/13/2011    Assessment / Plan: Progressive labor Will observe adequacy of contractions s/p AROM--augment prn   Magdaline Zollars 08/13/2011, 1:21 PM

## 2011-08-13 NOTE — Progress Notes (Signed)
  Subjective: Comfortable with epidural.  No pressure.  Objective: BP 131/70  Pulse 96  Temp 98.5 F (36.9 C) (Oral)  Resp 16  Ht 5\' 8"  (1.727 m)  Wt 152 lb (68.947 kg)  BMI 23.11 kg/m2  SpO2 99%  LMP 09/24/2010      FHT: Reassuring, sporadic mild variables/early decels UC:   q 3-4 min, MVUs 160 SVE:   Dilation: 10 Effacement (%): 80 Station: +1 Exam by:: Manfred Arch CNM No descent with single trial push.  Labs: Lab Results  Component Value Date   WBC 13.2* 08/13/2011   HGB 12.3 08/13/2011   HCT 35.1* 08/13/2011   MCV 88.9 08/13/2011   PLT 157 08/13/2011    Assessment / Plan: 2nd stage labor No urge to push Will allow to labor down, then initiate active pushing with urge.   Nigel Bridgeman 08/13/2011, 5:12 PM

## 2011-08-13 NOTE — H&P (Signed)
Heidi Hinton is a 21 y.o. female presenting for uc with mucus to spots of blood when using the bathroom, no srom, ha last pm, denies daily headaches,  visual spots or blurring, epigastric pain, or edema, with +FM. Ready for epidural  History OB History    Grav Para Term Preterm Abortions TAB SAB Ect Mult Living   1 0 0 0 0 0 0 0 0 0      Past Medical History  Diagnosis Date  . Anemia   . Rh negative status during pregnancy 04/2011  . LSIL (low grade squamous intraepithelial lesion) on Pap smear 03/11/2011  . Late prenatal care    Past Surgical History  Procedure Date  . Wisdom tooth extraction 2009   Family History: family history includes Alcohol abuse in her mother; Alzheimer's disease in her maternal grandmother; Asthma in her brother; Cancer in her brother and maternal grandmother; and Mental illness in her maternal grandmother.  There is no history of Anesthesia problems, and Hypotension, and Malignant hyperthermia, and Pseudochol deficiency, . Social History:  reports that she has never smoked. She has never used smokeless tobacco. She reports that she does not drink alcohol or use illicit drugs.   Prenatal Transfer Tool  Maternal Diabetes: No Genetic Screening: Declined Maternal Ultrasounds/Referrals: Normal Fetal Ultrasounds or other Referrals:  None Maternal Substance Abuse:  No Significant Maternal Medications:  None Significant Maternal Lab Results:  Lab values include: Group B Strep positive, Rh negative Other Comments:  None  ROS  Dilation: 3 Effacement (%): 100 Station: -1 Exam by:: Heidi Hinton CNM Blood pressure 143/68, pulse 64, temperature 98.4 F (36.9 C), temperature source Oral, resp. rate 20, height 5\' 8"  (1.727 m), weight 152 lb (68.947 kg), last menstrual period 09/24/2010. Exam Physical Exam Calm, no distress, HEENT WNL,  lungs clear bilaterally, AP RRR, abd soft nt,no masses, not tympanic bowel sounds active, abdomen nontender, Normal hair  distrubition mons pubis,  EGBUS WNL,  sterile speculum exam with scant tan discharge vagina pink, moist normal rugae,  Vag 3 100 -1/0 VTX Intact {edema to lower extremities, DTRS +2 bilaterally no clonus fhts category 1 uc q 3-5 mild to mod  Prenatal labs: ABO, Rh: --/--/O NEG (05/18 2200) Antibody: NEG (05/18 2200) Rubella: Immune (01/28 0000) RPR: NON REAC (04/18 1548)  HBsAg: Negative (01/28 0000)  HIV: Non-reactive (01/28 0000)  GBS: POSITIVE (06/12 1606)   Assessment/Plan: 40 57 week IUP early labor  elevated bp  GBS+  Routine admission, Pen G, epidural, PIH labs, collaboration with Dr. Stefano Hinton per telephone. Heidi Hinton 08/13/2011, 7:11 AM

## 2011-08-13 NOTE — Progress Notes (Signed)
  Subjective: CTSP for spontaneous decel during episode of spontaneous tachysystole, following cycle of early decels and variables.  Amnioinfusion begun almost at the time of onset of tachysystole.  Patient feeling some pressure in her vagina. O2 on.   Objective: BP 142/73  Pulse 91  Temp 98 F (36.7 C) (Oral)  Resp 20  Ht 5\' 8"  (1.727 m)  Wt 152 lb (68.947 kg)  BMI 23.11 kg/m2  SpO2 99%  LMP 09/24/2010      FHT: Decel to 70-90 during 5 minutes of tachysystole.  FSE applied.  Terbutaline 0.25 mg SQ given.  Dr. Normand Sloop in.  UC:  Tachysystole, then resolved to irregular contractions.  MVUs prior to episode 85-120, not on any augmentation.  Currently, FHR Category 1 with moderate variability, no decels. UCs q 2-4 min.  SVE:   Dilation: 5 Effacement (%): 80 Station: -1 Exam by:: Manfred Arch CNM  Assessment / Plan: Decel with spontaneous tachysystole--now resolved. Will CTO at present. Dr. Pennie Rushing updated.    Nigel Bridgeman 08/13/2011, 2:40 PM

## 2011-08-13 NOTE — Anesthesia Procedure Notes (Signed)

## 2011-08-13 NOTE — MAU Note (Signed)
Contractions every 2-10 mins apart. Light vaginal bleeding. Seen yesterday was 3cm.

## 2011-08-13 NOTE — Anesthesia Preprocedure Evaluation (Signed)

## 2011-08-13 NOTE — Progress Notes (Signed)
Subjective: Breathing with contractions, female support person at bedside.  Desires epidural.  Objective: BP 146/80  Pulse 69  Temp 98.3 F (36.8 C) (Oral)  Resp 20  Ht 5\' 8"  (1.727 m)  Wt 152 lb (68.947 kg)  BMI 23.11 kg/m2  LMP 09/24/2010      FHT:  Category 1 UC:   irregular, every 4-8nutes SVE:   Dilation: 4 Effacement (%): 80 Station: -1 Exam by:: Manfred Arch CNM  Labs: Lab Results  Component Value Date   WBC 13.2* 08/13/2011   HGB 12.3 08/13/2011   HCT 35.1* 08/13/2011   MCV 88.9 08/13/2011   PLT 157 08/13/2011    Assessment / Plan: Early labor Will place epidural, then plan AROM after patient comfortable.    Nigel Bridgeman 08/13/2011, 9:34 AM

## 2011-08-14 LAB — CBC
HCT: 22.5 % — ABNORMAL LOW (ref 36.0–46.0)
Hemoglobin: 7.9 g/dL — ABNORMAL LOW (ref 12.0–15.0)
MCH: 31.1 pg (ref 26.0–34.0)
MCHC: 35.1 g/dL (ref 30.0–36.0)
MCV: 88.6 fL (ref 78.0–100.0)
RBC: 2.54 MIL/uL — ABNORMAL LOW (ref 3.87–5.11)

## 2011-08-14 MED ORDER — POLYSACCHARIDE IRON COMPLEX 150 MG PO CAPS
150.0000 mg | ORAL_CAPSULE | Freq: Two times a day (BID) | ORAL | Status: DC
  Administered 2011-08-14 – 2011-08-15 (×3): 150 mg via ORAL
  Filled 2011-08-14 (×5): qty 1

## 2011-08-14 MED ORDER — RHO D IMMUNE GLOBULIN 1500 UNIT/2ML IJ SOLN
300.0000 ug | Freq: Once | INTRAMUSCULAR | Status: AC
  Administered 2011-08-14: 300 ug via INTRAMUSCULAR
  Filled 2011-08-14: qty 2

## 2011-08-14 NOTE — Progress Notes (Signed)
Patient ID: Heidi Hinton, female   DOB: [birth year], 21 y.o.   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  Just returned to pt's room now to round, and female holding newborn and pt not in room and he states he doesn't know where she went.  Will come back by to round later.

## 2011-08-14 NOTE — Anesthesia Postprocedure Evaluation (Signed)
  Anesthesia Post-op Note  

## 2011-08-14 NOTE — Progress Notes (Signed)
UR chart review completed.  

## 2011-08-14 NOTE — Anesthesia Postprocedure Evaluation (Signed)
  Anesthesia Post-op Note  Patient: Heidi Hinton  Procedure(s) Performed: * No surgery found *  Patient Location: Mother/Baby  Anesthesia Type: Epidural  Level of Consciousness: awake  Airway and Oxygen Therapy: Patient Spontanous Breathing  Post-op Pain: none  Post-op Assessment: Patient's Cardiovascular Status Stable, Respiratory Function Stable, Patent Airway, No signs of Nausea or vomiting, Adequate PO intake, Pain level controlled, No headache and No residual motor weakness  Post-op Vital Signs: Reviewed and stable  Complications: No apparent anesthesia complications

## 2011-08-15 ENCOUNTER — Encounter: Payer: Self-pay | Admitting: Obstetrics and Gynecology

## 2011-08-15 LAB — RH IG WORKUP (INCLUDES ABO/RH)
Antibody Screen: POSITIVE
DAT, IgG: NEGATIVE
Fetal Screen: NEGATIVE
Unit division: 0

## 2011-08-15 MED ORDER — IBUPROFEN 600 MG PO TABS: 600.0000 mg | ORAL_TABLET | Freq: Four times a day (QID) | ORAL | Status: AC

## 2011-08-15 MED ORDER — MEDROXYPROGESTERONE ACETATE 150 MG/ML IM SUSP
150.0000 mg | Freq: Once | INTRAMUSCULAR | Status: AC
  Administered 2011-08-15: 150 mg via INTRAMUSCULAR
  Filled 2011-08-15: qty 1

## 2011-08-15 MED ORDER — POLYSACCHARIDE IRON COMPLEX 150 MG PO CAPS: 150.0000 mg | ORAL_CAPSULE | Freq: Two times a day (BID) | ORAL | Status: DC

## 2011-08-22 NOTE — Discharge Summary (Signed)
Obstetric Discharge Summary Reason for Admission: onset of labor Prenatal Procedures: ultrasound Intrapartum Procedures: spontaneous vaginal delivery, GBS prophylaxis and epdiural, IUPC, amnioinfusion,  Postpartum Procedures: cytotec PR immediately after del of placenta for boggy uterus, Rhogam  Complications-Operative and Postpartum: periurethral laceration, anemia, infant w elevated bilirubin    Hemoglobin  Date Value Range Status  08/14/2011 7.9* 12.0 - 15.0 g/dL Final     DELTA CHECK NOTED     REPEATED TO VERIFY     HCT  Date Value Range Status  08/14/2011 22.5* 36.0 - 46.0 % Final    Hospital Course:  Hospital Course: Admitted in labor. BP was noted to be borderline elevated. PIH labs were normal, and UA was neg.  pos GBS. Pt rcv'd PCN per protocol.  Pt rcvd epidural, BP normalized. AROM for MSAF was done, IUPC placed, FHR decels w spont tachysystole and FSE placed. Amnioinfusion was begun. Progressed to fully dilated, and pt labored down, before actively pushing.  Delivery was performed by V.Emilee Hero, CNM.  without difficulty. Pt did receive cytotec PR after delivery of placenta for slightly boggy uterus. Patient and baby tolerated the procedure without difficulty, with a periurethral  laceration noted. Infant to FTN. Mother and infant then had an uncomplicated postpartum course, w BP's remaining normal and with breast feeding going well. Orthostatic VSS, pt declined a Blood tx. Infant did have an elevated bilirubin and was on double phototherapy, infant remained in FTN/tho at [facility] w mom. on d/c day. Mom's physical exam was WNL, and she was discharged home in stable condition. Contraception plan was depo-provera.  She received adequate benefit from po pain medications.  Discharge Diagnoses: Term Pregnancy-delivered, anemia, RH neg mother, RH pos baby   Discharge Information: Date: 08/22/2011 Activity: pelvic rest Diet: routine and iron - rich Medications:  Medication  List  As of 08/22/2011  2:30 PM   START taking these medications         ibuprofen 600 MG tablet   Commonly known as: ADVIL,MOTRIN   Take 1 tablet (600 mg total) by mouth every 6 (six) hours.      iron polysaccharides 150 MG capsule   Commonly known as: NIFEREX   Take 1 capsule (150 mg total) by mouth 2 (two) times daily.         CONTINUE taking these medications         prenatal multivitamin Tabs          Where to get your medications    These are the prescriptions that you need to pick up. We sent them to a specific pharmacy, so you will need to go there to get them.   [id number] - [place], [place] - [address] AT [facility] OF [address]    [address] [place] [address]    Phone: [phone]        ibuprofen 600 MG tablet   iron polysaccharides 150 MG capsule           Condition: stable Instructions: refer to practice specific booklet Discharge to: home Follow-up Information    Follow up with [facility] in 6 weeks. (call to make appointment for pap smear and colposcopy with MD )          Newborn Data: Live born  Information for the patient's newborn:  Heidi Hinton [[id number]]  female ; APGAR , 8, 9 ; weight ; 6#5oz  Infant remains as "baby" patient in FTN/at BS w  mom to receive phototherapy  Heidi Hinton 08/22/2011, 2:30 PM

## 2011-09-26 ENCOUNTER — Ambulatory Visit: Payer: Medicaid Other | Admitting: Obstetrics and Gynecology

## 2011-09-30 ENCOUNTER — Ambulatory Visit: Payer: Medicaid Other | Admitting: Obstetrics and Gynecology

## 2011-10-03 ENCOUNTER — Encounter: Payer: Self-pay | Admitting: Obstetrics and Gynecology

## 2011-10-03 ENCOUNTER — Ambulatory Visit (INDEPENDENT_AMBULATORY_CARE_PROVIDER_SITE_OTHER): Payer: Medicaid Other | Admitting: Obstetrics and Gynecology

## 2011-10-03 VITALS — BP 110/80 | Resp 20 | Wt 129.5 lb

## 2011-10-03 DIAGNOSIS — Z3049 Encounter for surveillance of other contraceptives: Secondary | ICD-10-CM

## 2011-10-03 DIAGNOSIS — D509 Iron deficiency anemia, unspecified: Secondary | ICD-10-CM

## 2011-10-03 DIAGNOSIS — E611 Iron deficiency: Secondary | ICD-10-CM

## 2011-10-03 DIAGNOSIS — Z3042 Encounter for surveillance of injectable contraceptive: Secondary | ICD-10-CM

## 2011-10-03 DIAGNOSIS — R87612 Low grade squamous intraepithelial lesion on cytologic smear of cervix (LGSIL): Secondary | ICD-10-CM

## 2011-10-03 NOTE — Progress Notes (Signed)
Heidi Hinton is a 21 y.o. female who presents for a postpartum visit.   Patient reports doing well.   Received Depo on day of d/c, wants to continue.  Needs Rx. No pp depression. Had increased bleeding immediately pp, with Hgb 7.7 pp. Needs pap today.  Hx remarkable for: Patient Active Problem List  Diagnosis  . Late prenatal care  . Anemia  . LSIL (low grade squamous intraepithelial lesion) on Pap smear  . Rh negative status during pregnancy  . URI (upper respiratory infection)  . Thrombocytopenia  . GBS (group B Streptococcus carrier), +RV culture, currently pregnant  . Small for gestational age  . Vaginal delivery  . Elevated blood pressure      PPDS = 2  Contraception plan:  Depo Provera--received on d/c from hospital.   I have fully reviewed the prenatal and intrapartum course   Patient has not been sexually active since delivery.   The following portions of the patient's history were reviewed and updated as appropriate: allergies, current medications, past family history, past medical history, past social history, past surgical history and problem list.  Review of Systems Pertinent items are noted in HPI.   Objective:    BP 110/80  Resp 20  Wt 129 lb 8 oz (58.741 kg)  LMP 09/24/2010  Breastfeeding? Yes  General:  alert, cooperative and no distress     Lungs: clear to auscultation bilaterally  Heart:  regular rate and rhythm, S1, S2 normal, no murmur  Abdomen: soft, non-tender; bowel sounds normal; no masses,  no organomegaly   Vulva:  normal  Vagina: normal vagina  Cervix:  normal  Uterus: normal size, contour, position, consistency, mobility, non-tender, well-involuted  Adnexa:  normal adnexa             Assessment:     Normal postpartum exam.  Pap smear done at today's visit.   Plan:  Follow-up for Depo in October--annual in 1 year.. Rxs:  Depo Provera 150 mg IM q 12 weeks x 1 year. Next dose due by 11/11/11.  Patient will call  office to schedule Depo as time approaches.  CBC today Pap today--will f/u regarding results.  Nigel Bridgeman CNM, [place] 10/03/2011 4:17 PM

## 2011-10-03 NOTE — Progress Notes (Addendum)
Date of delivery: 08/23/11 Female Name: Heidi Hinton Vaginal delivery:yes Cesarean section:no Tubal ligation:no GDM:no Breast Feeding:yes Bottle Feeding:yes Post-Partum Blues:no Abnormal pap:yes LSIL 03/21/11 PAP DUE TODAY  Normal GU function: yes Normal GI function:yes Returning to work:yes EPDS: 2  PT HAS DEPO

## 2011-10-04 LAB — CBC
HCT: 32.7 % — ABNORMAL LOW (ref 36.0–46.0)
MCH: 27.6 pg (ref 26.0–34.0)
MCHC: 33 g/dL (ref 30.0–36.0)
MCV: 83.4 fL (ref 78.0–100.0)
Platelets: 213 10*3/uL (ref 150–400)
RDW: 14.7 % (ref 11.5–15.5)
WBC: 6 10*3/uL (ref 4.0–10.5)

## 2011-10-08 LAB — PAP IG W/ RFLX HPV ASCU

## 2011-11-06 ENCOUNTER — Telehealth: Payer: Self-pay | Admitting: Obstetrics and Gynecology

## 2011-11-06 ENCOUNTER — Other Ambulatory Visit: Payer: Medicaid Other

## 2012-07-07 ENCOUNTER — Encounter (HOSPITAL_COMMUNITY): Payer: Self-pay | Admitting: Emergency Medicine

## 2012-07-07 ENCOUNTER — Emergency Department (HOSPITAL_COMMUNITY)
Admission: EM | Admit: 2012-07-07 | Discharge: 2012-07-07 | Disposition: A | Discharge status: Home / Self Care | Payer: Self-pay | Attending: Emergency Medicine | Admitting: Emergency Medicine

## 2012-07-07 DIAGNOSIS — Z862 Personal history of diseases of the blood and blood-forming organs and certain disorders involving the immune mechanism: Secondary | ICD-10-CM | POA: Insufficient documentation

## 2012-07-07 DIAGNOSIS — K0889 Other specified disorders of teeth and supporting structures: Secondary | ICD-10-CM

## 2012-07-07 DIAGNOSIS — K089 Disorder of teeth and supporting structures, unspecified: Secondary | ICD-10-CM | POA: Insufficient documentation

## 2012-07-07 DIAGNOSIS — Z8742 Personal history of other diseases of the female genital tract: Secondary | ICD-10-CM | POA: Insufficient documentation

## 2012-07-07 DIAGNOSIS — Z79899 Other long term (current) drug therapy: Secondary | ICD-10-CM | POA: Insufficient documentation

## 2012-07-07 MED ORDER — PENICILLIN V POTASSIUM 250 MG PO TABS
500.0000 mg | ORAL_TABLET | Freq: Once | ORAL | Status: AC
  Administered 2012-07-07: 500 mg via ORAL
  Filled 2012-07-07: qty 2

## 2012-07-07 MED ORDER — HYDROCODONE-ACETAMINOPHEN 5-325 MG PO TABS: 1.0000 | ORAL_TABLET | Freq: Once | ORAL | Status: DC

## 2012-07-07 MED ORDER — HYDROCODONE-ACETAMINOPHEN 5-325 MG PO TABS: 1.0000 | ORAL_TABLET | ORAL | Status: DC | PRN

## 2012-07-07 MED ORDER — HYDROCODONE-ACETAMINOPHEN 5-325 MG PO TABS
1.0000 | ORAL_TABLET | Freq: Once | ORAL | Status: AC
  Administered 2012-07-07: 1 via ORAL
  Filled 2012-07-07: qty 1

## 2012-07-07 MED ORDER — PENICILLIN V POTASSIUM 500 MG PO TABS: 500.0000 mg | ORAL_TABLET | Freq: Three times a day (TID) | ORAL | Status: DC

## 2012-07-07 NOTE — ED Notes (Addendum)
PT. REPORTS PERSISTENT LEFT UPPER MOLAR PAIN FOR SEVERAL MONTHS ( FILLING CAME OUT) .

## 2012-07-07 NOTE — ED Provider Notes (Signed)
Medical screening examination/treatment/procedure(s) were performed by non-physician practitioner and as supervising physician I was immediately available for consultation/collaboration.  Kong Packett M Hillery Bhalla, MD 07/07/12 0729 

## 2012-07-07 NOTE — ED Provider Notes (Signed)
History     CSN: [id number]  Arrival date & time 07/07/12  0309   None     Chief Complaint  Patient presents with  . Dental Pain    (Consider location/radiation/quality/duration/timing/severity/associated sxs/prior treatment) HPI History provided by pt.   Patient's filling in L upper molar fell out during her pregnancy last year.  Has had intermittent pain since then, but usually resolves w/ advil.  Over the past three days, pain has been more severe and refractory to OTC analgesics.  Radiates to entire L side of face.  No associated fever.    Past Medical History  Diagnosis Date  . Anemia   . Rh negative status during pregnancy 04/2011  . LSIL (low grade squamous intraepithelial lesion) on Pap smear 03/11/2011  . Late prenatal care     Past Surgical History  Procedure Laterality Date  . Wisdom tooth extraction  2009    Family History  Problem Relation Age of Onset  . Alcohol abuse Mother   . Asthma Brother   . Cancer Brother     osteosarcoma  . Cancer Maternal Grandmother   . Mental illness Maternal Grandmother   . Alzheimer's disease Maternal Grandmother   . Anesthesia problems Neg Hx   . Hypotension Neg Hx   . Malignant hyperthermia Neg Hx   . Pseudochol deficiency Neg Hx     History  Substance Use Topics  . Smoking status: Never Smoker   . Smokeless tobacco: Never Used  . Alcohol Use: No    OB History   Grav Para Term Preterm Abortions TAB SAB Ect Mult Living   1 1 1  0 0 0 0 0 0 1      Review of Systems  All other systems reviewed and are negative.    Allergies  Review of patient's allergies indicates no known allergies.  Home Medications   Current Outpatient Rx  Name  Route  Sig  Dispense  Refill  . Aspirin-Acetaminophen-Caffeine (GOODY HEADACHE PO)   Oral   Take 1 packet by mouth every 6 (six) hours as needed (pain).         . naproxen sodium (ANAPROX) 220 MG tablet   Oral   Take 220 mg by mouth 2 (two) times daily as needed (pain).          Marland Kitchen HYDROcodone-acetaminophen (NORCO/VICODIN) 5-325 MG per tablet   Oral   Take 1 tablet by mouth every 4 (four) hours as needed for pain.   15 tablet   0   . medroxyPROGESTERone (DEPO-PROVERA) 150 MG/ML injection   Intramuscular   Inject 150 mg into the muscle every 3 (three) months.         . penicillin v potassium (VEETID) 500 MG tablet   Oral   Take 1 tablet (500 mg total) by mouth 3 (three) times daily.   30 tablet   0     BP 134/75  Pulse 58  Temp(Src) 98.4 F (36.9 C) (Oral)  Resp 14  SpO2 100%  LMP 06/29/2012  Physical Exam  Nursing note and vitals reviewed. Constitutional: She is oriented to person, place, and time. She appears well-developed and well-nourished. No distress.  HENT:  Head: Normocephalic and atraumatic.  L upper first molar w/ filling.  Part of filling has fallen out.  Tooth mildly ttp.  Mild, diffuse gingivitis.  No edema of buccal mucosa. Otherwise unremarkable dentition.    Eyes:  Normal appearance  Neck: Normal range of motion. Neck supple.  Cardiovascular: Normal  rate and regular rhythm.   Pulmonary/Chest: Effort normal and breath sounds normal. No respiratory distress.  Musculoskeletal: Normal range of motion.  Lymphadenopathy:    She has cervical adenopathy.  Neurological: She is alert and oriented to person, place, and time.  Skin: Skin is warm and dry. No rash noted.  Psychiatric: She has a normal mood and affect. Her behavior is normal.    ED Course  Procedures (including critical care time)  Labs Reviewed - No data to display No results found.   1. Toothache       MDM  22yo F presents w/ toothache.  Possible periapical abscess L upper first molar.  Abx and 15 vicodin prescribed.  Referred to dentist on call.  Pt aware that she should schedule appt w/in 48 hours.  Return precautions discussed.         Otilio Miu, PA-C 07/07/12 0601  Otilio Miu, PA-C 07/07/12 [phone]

## 2012-12-29 ENCOUNTER — Inpatient Hospital Stay (HOSPITAL_COMMUNITY): Payer: Medicaid Other

## 2012-12-29 ENCOUNTER — Inpatient Hospital Stay (HOSPITAL_COMMUNITY)
Admission: AD | Admit: 2012-12-29 | Discharge: 2012-12-29 | Disposition: A | Discharge status: Home / Self Care | Payer: Medicaid Other | Source: Ambulatory Visit | Attending: Family Medicine | Admitting: Family Medicine

## 2012-12-29 ENCOUNTER — Encounter (HOSPITAL_COMMUNITY): Payer: Self-pay | Admitting: *Deleted

## 2012-12-29 DIAGNOSIS — R109 Unspecified abdominal pain: Secondary | ICD-10-CM | POA: Insufficient documentation

## 2012-12-29 DIAGNOSIS — R112 Nausea with vomiting, unspecified: Secondary | ICD-10-CM

## 2012-12-29 DIAGNOSIS — O219 Vomiting of pregnancy, unspecified: Secondary | ICD-10-CM

## 2012-12-29 DIAGNOSIS — O99891 Other specified diseases and conditions complicating pregnancy: Secondary | ICD-10-CM | POA: Insufficient documentation

## 2012-12-29 DIAGNOSIS — O210 Mild hyperemesis gravidarum: Secondary | ICD-10-CM | POA: Insufficient documentation

## 2012-12-29 LAB — CBC
Platelets: 146 10*3/uL — ABNORMAL LOW (ref 150–400)
RBC: 3.75 MIL/uL — ABNORMAL LOW (ref 3.87–5.11)
RDW: 13.4 % (ref 11.5–15.5)
WBC: 7.9 10*3/uL (ref 4.0–10.5)

## 2012-12-29 LAB — URINALYSIS, ROUTINE W REFLEX MICROSCOPIC
Hgb urine dipstick: NEGATIVE
Nitrite: NEGATIVE
Specific Gravity, Urine: 1.01 (ref 1.005–1.030)
Urobilinogen, UA: 2 mg/dL — ABNORMAL HIGH (ref 0.0–1.0)
pH: 8.5 — ABNORMAL HIGH (ref 5.0–8.0)

## 2012-12-29 LAB — WET PREP, GENITAL
Trich, Wet Prep: NONE SEEN
Yeast Wet Prep HPF POC: NONE SEEN

## 2012-12-29 LAB — POCT PREGNANCY, URINE: Preg Test, Ur: POSITIVE — AB

## 2012-12-29 MED ORDER — ONDANSETRON 4 MG PO TBDP
4.0000 mg | ORAL_TABLET | Freq: Once | ORAL | Status: AC
  Administered 2012-12-29: 4 mg via ORAL
  Filled 2012-12-29: qty 1

## 2012-12-29 MED ORDER — ONDANSETRON 4 MG PO TBDP: 4.0000 mg | ORAL_TABLET | Freq: Three times a day (TID) | ORAL | Status: DC | PRN

## 2012-12-29 NOTE — MAU Note (Signed)
Patient states she has not had Depo for about one year. Has not had periods. States her child and she have had nausea, vomiting and diarrhea for the past 3 weeks but now thinks she might be pregnant. Diarrhea stopped last week. Has had abdominal pain for about 3 weeks. Denies bleeding or discharge.

## 2012-12-29 NOTE — MAU Provider Note (Signed)
Chart reviewed and agree with management and plan.  

## 2012-12-29 NOTE — MAU Provider Note (Signed)
Subjective:  Heidi Hinton is a 22 yof G2P1001 at unknown gestation who presents today with a 2wk hx of nausea, vomiting and abd pain.  She references the pain as being periumbilical and constant.  She states her nausea has been constant over the past two wks and she has not taken any medication to improve her symptoms.  She also reports having diarrhea x2wks. She denies fever, chills, vaginal discharge/bleeding, dysuria.  She states there is a possibility she could be pregnant today.   Pt states her LMP was prior to having her first son, who is now 18 months.     Patient Active Problem List   Diagnosis Date Noted  . Depo-Provera contraceptive status 10/03/2011  . Elevated blood pressure 08/22/2011  . Vaginal delivery 08/13/2011  . Small for gestational age 75/04/2011  . GBS (group B Streptococcus carrier), +RV culture, currently pregnant 06/27/2011  . URI (upper respiratory infection) 06/22/2011  . Thrombocytopenia 06/22/2011  . Late prenatal care 05/22/2011  . Anemia 05/22/2011  . Rh negative status during pregnancy 05/22/2011  . LSIL (low grade squamous intraepithelial lesion) on Pap smear 03/11/2011   Past Medical History  Diagnosis Date  . Anemia   . Rh negative status during pregnancy 04/2011  . LSIL (low grade squamous intraepithelial lesion) on Pap smear 03/11/2011  . Late prenatal care     Past Surgical History  Procedure Laterality Date  . Wisdom tooth extraction  2009    Prescriptions prior to admission  Medication Sig Dispense Refill  . Aspirin-Acetaminophen-Caffeine (GOODY HEADACHE PO) Take 1 packet by mouth every 6 (six) hours as needed (pain).      . Phenylephrine-DM-GG (MUCINEX CONGEST & COUGH CHILD) 2.5-5-100 MG/5ML LIQD Take 10 mLs by mouth daily as needed (For cold symtpoms.).       No Known Allergies  History  Substance Use Topics  . Smoking status: Never Smoker   . Smokeless tobacco: Never Used  . Alcohol Use: No    Family History  Problem Relation Age of  Onset  . Alcohol abuse Mother   . Asthma Brother   . Cancer Brother     osteosarcoma  . Cancer Maternal Grandmother   . Mental illness Maternal Grandmother   . Alzheimer's disease Maternal Grandmother   . Anesthesia problems Neg Hx   . Hypotension Neg Hx   . Malignant hyperthermia Neg Hx   . Pseudochol deficiency Neg Hx     Results for orders placed during the hospital encounter of 12/29/12 (from the past 24 hour(s))  URINALYSIS, ROUTINE W REFLEX MICROSCOPIC     Status: Abnormal   Collection Time    12/29/12 11:15 AM      Result Value Range   Color, Urine YELLOW  YELLOW   APPearance HAZY (*) CLEAR   Specific Gravity, Urine 1.010  1.005 - 1.030   pH 8.5 (*) 5.0 - 8.0   Glucose, UA NEGATIVE  NEGATIVE mg/dL   Hgb urine dipstick NEGATIVE  NEGATIVE   Bilirubin Urine NEGATIVE  NEGATIVE   Ketones, ur NEGATIVE  NEGATIVE mg/dL   Protein, ur NEGATIVE  NEGATIVE mg/dL   Urobilinogen, UA 2.0 (*) 0.0 - 1.0 mg/dL   Nitrite NEGATIVE  NEGATIVE   Leukocytes, UA NEGATIVE  NEGATIVE  POCT PREGNANCY, URINE     Status: Abnormal   Collection Time    12/29/12 11:38 AM      Result Value Range   Preg Test, Ur POSITIVE (*) NEGATIVE  HCG, QUANTITATIVE, PREGNANCY  Status: Abnormal   Collection Time    12/29/12 12:07 PM      Result Value Range   hCG, Beta Chain, Heidi Hinton [id number] (*) <5 mIU/mL  CBC     Status: Abnormal   Collection Time    12/29/12 12:10 PM      Result Value Range   WBC 7.9  4.0 - 10.5 K/uL   RBC 3.75 (*) 3.87 - 5.11 MIL/uL   Hemoglobin 11.4 (*) 12.0 - 15.0 g/dL   HCT 19.1 (*) 47.8 - 29.5 %   MCV 84.3  78.0 - 100.0 fL   MCH 30.4  26.0 - 34.0 pg   MCHC 36.1 (*) 30.0 - 36.0 g/dL   RDW 62.1  30.8 - 65.7 %   Platelets 146 (*) 150 - 400 K/uL  WET PREP, GENITAL     Status: Abnormal   Collection Time    12/29/12 12:24 PM      Result Value Range   Yeast Wet Prep HPF POC NONE SEEN  NONE SEEN   Trich, Wet Prep NONE SEEN  NONE SEEN   Clue Cells Wet Prep HPF POC FEW (*) NONE SEEN    WBC, Wet Prep HPF POC FEW (*) NONE SEEN    Review of Systems Pertinent items are noted in HPI.  Objective:   Patient Vitals for the past 8 hrs:  BP Temp Temp src Pulse Resp SpO2 Height Weight  12/29/12 1111 130/75 mmHg 98.1 F (36.7 C) Oral 87 16 100 % 5\' 7"  (1.702 m) 112 lb 9.6 oz (51.075 kg)   BP 130/75  Pulse 87  Temp(Src) 98.1 F (36.7 C) (Oral)  Resp 16  Ht 5\' 7"  (1.702 m)  Wt 112 lb 9.6 oz (51.075 kg)  BMI 17.63 kg/m2  SpO2 100%  LMP 06/29/2012 General appearance: alert, cooperative, mild distress and from discomfort of nausea Abdomen: normal findings: bowel sounds normal, no masses palpable and no organomegaly and abnormal findings:  mild tenderness in the periumbilical area and umbilical hernia Pelvic: cervix normal in appearance, external genitalia normal, no adnexal masses or tenderness, no cervical motion tenderness, uterus normal size, shape, and consistency and . Thick, white, non-odorous discharge noted.   Skin: Skin color, texture, turgor normal. No rashes or lesions   Assessment:  R/O ectopic pregnancy Pregnancy induced nausea/vomiting  Plan: CBC, UA, BHCG, US-transvaginal, ABO Rh, vaginal wet prep, GC/Chlamydia Preliminary report given to me by [place]. Normal scan at [redacted]w[redacted]d. EDD 06/25/2012   A: 1. IUP (intrauterine pregnancy), incidental   2. Nausea/vomiting in pregnancy    P: Discharge home Start prenatal care as soon as possible RX: Zofran  Return to MAU as needed, if symptoms worsen Pt is O negative blood type: instructed to come to MAU with any vaginal bleeding.   Evaluation and management procedures were performed by the PA student under my supervision and collaboration. I have reviewed the note and chart, and I agree with the management and plan.  Iona Hansen Kariah Loredo, NP 12/29/2012 4:45 PM

## 2013-02-04 NOTE — L&D Delivery Note (Signed)
Delivery Note At 4:19 AM a viable female was delivered via Vaginal, Spontaneous Delivery (Presentation: Right Occiput Anterior).  APGAR: 9, 9; weight pending.   Called to delivery. Patient admitted for PROM. Delivery augmented with pitocin. Patient GBS positive and received PCN. Infant delivered to maternal abdomen. Cord clamped and cut. Active management of 3rd stage with traction and Pitocin. Placenta delivered intact with 3v cord. Counts correct. Hemostatic.   Anesthesia: Epidural  Episiotomy: None Lacerations: Right periurethral Suture Repair: 4.0 vicryl Est. Blood Loss (mL): 100  Mom to postpartum.  Baby to Couplet care / Skin to Skin.  Glori Luisric G Sonnenberg 06/22/2013, 4:51 AM   I was present for the entirety of this delivery and supervised resident with delivery and repair.  Tawana ScaleMichael Ryan Salimata Christenson, MD OB Fellow

## 2013-04-14 ENCOUNTER — Encounter (HOSPITAL_COMMUNITY): Payer: Self-pay | Admitting: *Deleted

## 2013-04-14 ENCOUNTER — Inpatient Hospital Stay (HOSPITAL_COMMUNITY)
Admission: AD | Admit: 2013-04-14 | Discharge: 2013-04-14 | Disposition: A | Discharge status: Home / Self Care | Payer: Medicaid Other | Source: Ambulatory Visit | Attending: Obstetrics and Gynecology | Admitting: Obstetrics and Gynecology

## 2013-04-14 DIAGNOSIS — N949 Unspecified condition associated with female genital organs and menstrual cycle: Secondary | ICD-10-CM | POA: Insufficient documentation

## 2013-04-14 DIAGNOSIS — O9989 Other specified diseases and conditions complicating pregnancy, childbirth and the puerperium: Principal | ICD-10-CM

## 2013-04-14 DIAGNOSIS — O99891 Other specified diseases and conditions complicating pregnancy: Secondary | ICD-10-CM | POA: Insufficient documentation

## 2013-04-14 LAB — URINE MICROSCOPIC-ADD ON

## 2013-04-14 LAB — URINALYSIS, ROUTINE W REFLEX MICROSCOPIC
Bilirubin Urine: NEGATIVE
Glucose, UA: NEGATIVE mg/dL
Hgb urine dipstick: NEGATIVE
KETONES UR: NEGATIVE mg/dL
NITRITE: NEGATIVE
PROTEIN: NEGATIVE mg/dL
Specific Gravity, Urine: 1.01 (ref 1.005–1.030)
UROBILINOGEN UA: 0.2 mg/dL (ref 0.0–1.0)
pH: 6.5 (ref 5.0–8.0)

## 2013-04-14 NOTE — Discharge Instructions (Signed)
Third Trimester of Pregnancy  The third trimester is from week 29 through week 42, months 7 through 9. The third trimester is a time when the fetus is growing rapidly. At the end of the ninth month, the fetus is about 20 inches in length and weighs 6 10 pounds.   BODY CHANGES  Your body goes through many changes during pregnancy. The changes vary from woman to woman.    Your weight will continue to increase. You can expect to gain 25 35 pounds (11 16 kg) by the end of the pregnancy.   You may begin to get stretch marks on your hips, abdomen, and breasts.   You may urinate more often because the fetus is moving lower into your pelvis and pressing on your bladder.   You may develop or continue to have heartburn as a result of your pregnancy.   You may develop constipation because certain hormones are causing the muscles that push waste through your intestines to slow down.   You may develop hemorrhoids or swollen, bulging veins (varicose veins).   You may have pelvic pain because of the weight gain and pregnancy hormones relaxing your joints between the bones in your pelvis. Back aches may result from over exertion of the muscles supporting your posture.   Your breasts will continue to grow and be tender. A yellow discharge may leak from your breasts called colostrum.   Your belly button may stick out.   You may feel short of breath because of your expanding uterus.   You may notice the fetus "dropping," or moving lower in your abdomen.   You may have a bloody mucus discharge. This usually occurs a few days to a week before labor begins.   Your cervix becomes thin and soft (effaced) near your due date.  WHAT TO EXPECT AT YOUR PRENATAL EXAMS   You will have prenatal exams every 2 weeks until week 36. Then, you will have weekly prenatal exams. During a routine prenatal visit:   You will be weighed to make sure you and the fetus are growing normally.   Your blood pressure is taken.   Your abdomen will be  measured to track your baby's growth.   The fetal heartbeat will be listened to.   Any test results from the previous visit will be discussed.   You may have a cervical check near your due date to see if you have effaced.  At around 36 weeks, your caregiver will check your cervix. At the same time, your caregiver will also perform a test on the secretions of the vaginal tissue. This test is to determine if a type of bacteria, Group B streptococcus, is present. Your caregiver will explain this further.  Your caregiver may ask you:   What your birth plan is.   How you are feeling.   If you are feeling the baby move.   If you have had any abnormal symptoms, such as leaking fluid, bleeding, severe headaches, or abdominal cramping.   If you have any questions.  Other tests or screenings that may be performed during your third trimester include:   Blood tests that check for low iron levels (anemia).   Fetal testing to check the health, activity level, and growth of the fetus. Testing is done if you have certain medical conditions or if there are problems during the pregnancy.  FALSE LABOR  You may feel small, irregular contractions that eventually go away. These are called Braxton Hicks contractions, or   false labor. Contractions may last for hours, days, or even weeks before true labor sets in. If contractions come at regular intervals, intensify, or become painful, it is best to be seen by your caregiver.   SIGNS OF LABOR    Menstrual-like cramps.   Contractions that are 5 minutes apart or less.   Contractions that start on the top of the uterus and spread down to the lower abdomen and back.   A sense of increased pelvic pressure or back pain.   A watery or bloody mucus discharge that comes from the vagina.  If you have any of these signs before the 37th week of pregnancy, call your caregiver right away. You need to go to the hospital to get checked immediately.  HOME CARE INSTRUCTIONS    Avoid all  smoking, herbs, alcohol, and unprescribed drugs. These chemicals affect the formation and growth of the baby.   Follow your caregiver's instructions regarding medicine use. There are medicines that are either safe or unsafe to take during pregnancy.   Exercise only as directed by your caregiver. Experiencing uterine cramps is a good sign to stop exercising.   Continue to eat regular, healthy meals.   Wear a good support bra for breast tenderness.   Do not use hot tubs, steam rooms, or saunas.   Wear your seat belt at all times when driving.   Avoid raw meat, uncooked cheese, cat litter boxes, and soil used by cats. These carry germs that can cause birth defects in the baby.   Take your prenatal vitamins.   Try taking a stool softener (if your caregiver approves) if you develop constipation. Eat more high-fiber foods, such as fresh vegetables or fruit and whole grains. Drink plenty of fluids to keep your urine clear or pale yellow.   Take warm sitz baths to soothe any pain or discomfort caused by hemorrhoids. Use hemorrhoid cream if your caregiver approves.   If you develop varicose veins, wear support hose. Elevate your feet for 15 minutes, 3 4 times a day. Limit salt in your diet.   Avoid heavy lifting, wear low heal shoes, and practice good posture.   Rest a lot with your legs elevated if you have leg cramps or low back pain.   Visit your dentist if you have not gone during your pregnancy. Use a soft toothbrush to brush your teeth and be gentle when you floss.   A sexual relationship may be continued unless your caregiver directs you otherwise.   Do not travel far distances unless it is absolutely necessary and only with the approval of your caregiver.   Take prenatal classes to understand, practice, and ask questions about the labor and delivery.   Make a trial run to the hospital.   Pack your hospital bag.   Prepare the baby's nursery.   Continue to go to all your prenatal visits as directed  by your caregiver.  SEEK MEDICAL CARE IF:   You are unsure if you are in labor or if your water has broken.   You have dizziness.   You have mild pelvic cramps, pelvic pressure, or nagging pain in your abdominal area.   You have persistent nausea, vomiting, or diarrhea.   You have a bad smelling vaginal discharge.   You have pain with urination.  SEEK IMMEDIATE MEDICAL CARE IF:    You have a fever.   You are leaking fluid from your vagina.   You have spotting or bleeding from your vagina.     You have severe abdominal cramping or pain.   You have rapid weight loss or gain.   You have shortness of breath with chest pain.   You notice sudden or extreme swelling of your face, hands, ankles, feet, or legs.   You have not felt your baby move in over an hour.   You have severe headaches that do not go away with medicine.   You have vision changes.  Document Released: 01/15/2001 Document Revised: 09/23/2012 Document Reviewed: 03/24/2012  [employer] Patient Information 2014 [employer].

## 2013-04-14 NOTE — MAU Provider Note (Signed)
History     CSN: [id number]  Arrival date and time: 04/14/13 1906   None     Chief Complaint  Patient presents with  . Abdominal Pain   HPI Heidi Hinton is a 23yo G2P1 at 29.5wks by 14wk U/S who presents for eval of low mid abd pain she describes as sharp and intermittent. Notices it mostly when eating. No N/V/D. No fever, leaking or bldg. [facility], but is waiting to see if they will accept her this late in the pregnancy.   OB History   Grav Para Term Preterm Abortions TAB SAB Ect Mult Living   2 1 1  0 0 0 0 0 0 1      Past Medical History  Diagnosis Date  . Anemia   . Rh negative status during pregnancy 04/2011  . LSIL (low grade squamous intraepithelial lesion) on Pap smear 03/11/2011  . Late prenatal care     Past Surgical History  Procedure Laterality Date  . Wisdom tooth extraction  2009    Family History  Problem Relation Age of Onset  . Alcohol abuse Mother   . Asthma Brother   . Cancer Brother     osteosarcoma  . Cancer Maternal Grandmother   . Mental illness Maternal Grandmother   . Alzheimer's disease Maternal Grandmother   . Anesthesia problems Neg Hx   . Hypotension Neg Hx   . Malignant hyperthermia Neg Hx   . Pseudochol deficiency Neg Hx     History  Substance Use Topics  . Smoking status: Never Smoker   . Smokeless tobacco: Never Used  . Alcohol Use: No    Allergies: No Known Allergies  Prescriptions prior to admission  Medication Sig Dispense Refill  . Prenatal Vit-Fe Fumarate-FA (PRENATAL MULTIVITAMIN) TABS tablet Take 1 tablet by mouth daily at 12 noon.      . [DISCONTINUED] ondansetron (ZOFRAN ODT) 4 MG disintegrating tablet Take 1 tablet (4 mg total) by mouth every 8 (eight) hours as needed for nausea or vomiting.  20 tablet  0    ROS Physical Exam   Blood pressure 101/84, pulse 88, temperature 97.7 F (36.5 C), temperature source Oral, resp. rate 18, height 5\' 7"   (1.702 m), weight 60.782 kg (134 lb), last menstrual period 06/29/2012, SpO2 100.00%.  Physical Exam  Constitutional: She is oriented to person, place, and time. She appears well-developed.  HENT:  Head: Normocephalic.  Neck: Normal range of motion.  Cardiovascular: Normal rate.   Respiratory: Effort normal.  GI:  EFM 120-125, +accels, no decels, occ mi variables Toco: some irritability at times, none currently, no ctx  Genitourinary:  Cx C/L/post/firm  Musculoskeletal: Normal range of motion.  Neurological: She is alert and oriented to person, place, and time.  Skin: Skin is warm and dry.  Psychiatric: She has a normal mood and affect. Her behavior is normal. Thought content normal.   Urinalysis    Component Value Date/Time   COLORURINE YELLOW 04/14/2013 1912   APPEARANCEUR CLEAR 04/14/2013 1912   LABSPEC 1.010 04/14/2013 1912   PHURINE 6.5 04/14/2013 1912   GLUCOSEU NEGATIVE 04/14/2013 1912   HGBUR NEGATIVE 04/14/2013 1912   BILIRUBINUR NEGATIVE 04/14/2013 1912   KETONESUR NEGATIVE 04/14/2013 1912   PROTEINUR NEGATIVE 04/14/2013 1912   UROBILINOGEN 0.2 04/14/2013 1912   NITRITE NEGATIVE 04/14/2013 1912   LEUKOCYTESUR SMALL* 04/14/2013 1912   Micro: rare squam, rare bacteria   MAU Course  Procedures    Assessment and Plan  IUP at 29.5wks Round lig pain  D/C home Rev'd comfort measures Is waiting until 3/19 to hear back from [facility] re her being taken on as a pt due to her late gestation; given the number to the [facility] to call if unable to be seen at [facility]. Strongly encouraged care at either location ASAP.  Cam Hai 04/14/2013, 9:12 PM

## 2013-04-14 NOTE — MAU Note (Signed)
Pt states she has been having contraction like pains for the past 4 days. Pt states pains are intermittent

## 2013-04-14 NOTE — MAU Note (Signed)
Pt reports sharp pains in lower abd x 4 days. Denies bleeding.

## 2013-04-16 NOTE — MAU Provider Note (Signed)
`````  Attestation of Attending Supervision of Advanced Practitioner: Evaluation and management procedures were performed by the PA/NP/CNM/OB Fellow under my supervision/collaboration. Chart reviewed and agree with management and plan.  Tilda BurrowFERGUSON,Richardo Popoff V 04/16/2013 4:49 PM

## 2013-05-31 ENCOUNTER — Encounter: Payer: Medicaid Other | Admitting: Family Medicine

## 2013-06-10 ENCOUNTER — Encounter: Payer: Self-pay | Admitting: Obstetrics & Gynecology

## 2013-06-10 ENCOUNTER — Ambulatory Visit (INDEPENDENT_AMBULATORY_CARE_PROVIDER_SITE_OTHER): Payer: Medicaid Other | Admitting: Obstetrics & Gynecology

## 2013-06-10 ENCOUNTER — Ambulatory Visit (HOSPITAL_COMMUNITY): Admission: RE | Admit: 2013-06-10 | Payer: Medicaid Other | Source: Ambulatory Visit

## 2013-06-10 VITALS — BP 115/73 | HR 70 | Wt 136.4 lb

## 2013-06-10 DIAGNOSIS — O360990 Maternal care for other rhesus isoimmunization, unspecified trimester, not applicable or unspecified: Secondary | ICD-10-CM

## 2013-06-10 DIAGNOSIS — D689 Coagulation defect, unspecified: Secondary | ICD-10-CM

## 2013-06-10 DIAGNOSIS — O0930 Supervision of pregnancy with insufficient antenatal care, unspecified trimester: Secondary | ICD-10-CM

## 2013-06-10 DIAGNOSIS — Z6791 Unspecified blood type, Rh negative: Secondary | ICD-10-CM

## 2013-06-10 DIAGNOSIS — Z23 Encounter for immunization: Secondary | ICD-10-CM

## 2013-06-10 DIAGNOSIS — O99119 Other diseases of the blood and blood-forming organs and certain disorders involving the immune mechanism complicating pregnancy, unspecified trimester: Secondary | ICD-10-CM

## 2013-06-10 DIAGNOSIS — O26899 Other specified pregnancy related conditions, unspecified trimester: Secondary | ICD-10-CM

## 2013-06-10 DIAGNOSIS — D6959 Other secondary thrombocytopenia: Secondary | ICD-10-CM

## 2013-06-10 DIAGNOSIS — O99113 Other diseases of the blood and blood-forming organs and certain disorders involving the immune mechanism complicating pregnancy, third trimester: Secondary | ICD-10-CM

## 2013-06-10 LAB — POCT URINALYSIS DIP (DEVICE)
BILIRUBIN URINE: NEGATIVE
Glucose, UA: NEGATIVE mg/dL
HGB URINE DIPSTICK: NEGATIVE
KETONES UR: NEGATIVE mg/dL
LEUKOCYTES UA: NEGATIVE
Nitrite: NEGATIVE
PH: 7 (ref 5.0–8.0)
PROTEIN: NEGATIVE mg/dL
SPECIFIC GRAVITY, URINE: 1.015 (ref 1.005–1.030)
Urobilinogen, UA: 0.2 mg/dL (ref 0.0–1.0)

## 2013-06-10 LAB — OB RESULTS CONSOLE GC/CHLAMYDIA
CHLAMYDIA, DNA PROBE: NEGATIVE
Gonorrhea: NEGATIVE

## 2013-06-10 LAB — OB RESULTS CONSOLE GBS: STREP GROUP B AG: POSITIVE

## 2013-06-10 MED ORDER — TETANUS-DIPHTH-ACELL PERTUSSIS 5-2.5-18.5 LF-MCG/0.5 IM SUSP: 0.5000 mL | Freq: Once | INTRAMUSCULAR | Status: DC

## 2013-06-10 MED ORDER — RHO D IMMUNE GLOBULIN 1500 UNIT/2ML IJ SOSY
300.0000 ug | PREFILLED_SYRINGE | Freq: Once | INTRAMUSCULAR | Status: AC
  Administered 2013-06-10: 300 ug via INTRAMUSCULAR

## 2013-06-10 NOTE — Progress Notes (Signed)
   Subjective:    Heidi Hinton is a 23 y.o. G2P1001at 7512w6d being seen today for her first obstetrical visit.  Her obstetrical history is significant for late prenatal care, one term SVD (6-5). Patient does intend to breast feed. Pregnancy history fully reviewed.  Patient reports no complaints.  Filed Vitals:   06/10/13 0804  BP: 115/73  Pulse: 70  Weight: 136 lb 6.4 oz (61.871 kg)    HISTORY: OB History  Gravida Para Term Preterm AB SAB TAB Ectopic Multiple Living  2 1 1  0 0 0 0 0 0 1    # Outcome Date GA Lbr Len/2nd Weight Sex Delivery Anes PTL Lv  2 CUR           1 TRM 08/13/11 575w5d 12:01 / 01:35 6 lb 4.9 oz (2.86 kg) M SVD EPI  Y     Comments: none     Past Medical History  Diagnosis Date  . Anemia   . Rh negative status during pregnancy 04/2011  . LSIL (low grade squamous intraepithelial lesion) on Pap smear 03/11/2011  . Late prenatal care    Past Surgical History  Procedure Laterality Date  . Wisdom tooth extraction  2009   Family History  Problem Relation Age of Onset  . Alcohol abuse Mother   . Asthma Brother   . Cancer Brother     osteosarcoma  . Cancer Maternal Grandmother   . Mental illness Maternal Grandmother   . Alzheimer's disease Maternal Grandmother   . Anesthesia problems Neg Hx   . Hypotension Neg Hx   . Malignant hyperthermia Neg Hx   . Pseudochol deficiency Neg Hx      Exam    Uterus:     Pelvic Exam:    Perineum: No Hemorrhoids, Normal Perineum   Vulva: normal   Vagina:  normal mucosa, normal discharge   Cervix: multiparous appearance and no cervical motion tenderness   Adnexa: normal adnexa and no mass, fullness, tenderness   Bony Pelvis: average and proven to 6-5  System: Breast:  deferred   Skin: normal coloration and turgor, no rashes   Neurologic: oriented, normal   Extremities: normal strength, tone, and muscle mass   HEENT PERRLA and extra ocular movement intact   Mouth/Teeth mucous membranes moist, pharynx normal  without lesions and dental hygiene good   Neck supple and no masses   Cardiovascular: regular rate and rhythm   Respiratory:  appears well, vitals normal, no respiratory distress, acyanotic, normal RR, chest clear, no wheezing, crepitations, rhonchi, normal symmetric air entry   Abdomen: soft, non-tender; bowel sounds normal; no masses,  no organomegaly, gravid   Urinary: urethral meatus normal     Assessment:    Pregnancy: G2P1001 Patient Active Problem List   Diagnosis Date Noted  . Maternal thrombocytopenia complicating pregnancy in third trimester 06/22/2011  . Late prenatal care 05/22/2011  . Rh negative status during pregnancy 05/22/2011     Plan:   Initial and third trimester labs drawn, pelvic cultures sent.  Will do pap postpartum. Continue prenatal vitamins. Rhogam and Tdap to be given today Problem list reviewed and updated. Ultrasound discussed; fetal survey: ordered. Follow up in 1 weeks.  Fetal movement and labor precautions reviewed.    Tereso NewcomerUgonna A Tinaya Ceballos, MD 06/10/2013

## 2013-06-10 NOTE — Patient Instructions (Signed)
Return to clinic for any obstetric concerns or go to MAU for evaluation  

## 2013-06-10 NOTE — Progress Notes (Signed)
U/S scheduled today at 315 pm.

## 2013-06-11 LAB — OBSTETRIC PANEL
ANTIBODY SCREEN: NEGATIVE
BASOS ABS: 0 10*3/uL (ref 0.0–0.1)
BASOS PCT: 0 % (ref 0–1)
EOS PCT: 0 % (ref 0–5)
Eosinophils Absolute: 0 10*3/uL (ref 0.0–0.7)
HEMATOCRIT: 32.5 % — AB (ref 36.0–46.0)
HEMOGLOBIN: 11.1 g/dL — AB (ref 12.0–15.0)
Hepatitis B Surface Ag: NEGATIVE
Lymphocytes Relative: 19 % (ref 12–46)
Lymphs Abs: 1.8 10*3/uL (ref 0.7–4.0)
MCH: 31.4 pg (ref 26.0–34.0)
MCHC: 34.2 g/dL (ref 30.0–36.0)
MCV: 92.1 fL (ref 78.0–100.0)
MONO ABS: 0.8 10*3/uL (ref 0.1–1.0)
MONOS PCT: 8 % (ref 3–12)
Neutro Abs: 7 10*3/uL (ref 1.7–7.7)
Neutrophils Relative %: 73 % (ref 43–77)
Platelets: 185 10*3/uL (ref 150–400)
RBC: 3.53 MIL/uL — ABNORMAL LOW (ref 3.87–5.11)
RDW: 14.2 % (ref 11.5–15.5)
RH TYPE: NEGATIVE
RUBELLA: 13.3 {index} — AB (ref <0.90)
WBC: 9.6 10*3/uL (ref 4.0–10.5)

## 2013-06-11 LAB — GC/CHLAMYDIA PROBE AMP
CT Probe RNA: NEGATIVE
GC Probe RNA: NEGATIVE

## 2013-06-11 LAB — GLUCOSE TOLERANCE, 1 HOUR (50G) W/O FASTING: GLUCOSE 1 HOUR GTT: 95 mg/dL (ref 70–140)

## 2013-06-11 LAB — HIV ANTIBODY (ROUTINE TESTING W REFLEX): HIV: NONREACTIVE

## 2013-06-12 LAB — CULTURE, OB URINE

## 2013-06-13 LAB — CULTURE, STREPTOCOCCUS GRP B W/SUSCEPT

## 2013-06-14 ENCOUNTER — Encounter: Payer: Self-pay | Admitting: Obstetrics & Gynecology

## 2013-06-14 DIAGNOSIS — O99820 Streptococcus B carrier state complicating pregnancy: Secondary | ICD-10-CM | POA: Insufficient documentation

## 2013-06-14 LAB — HEMOGLOBINOPATHY EVALUATION
HGB F QUANT: 0 % (ref 0.0–2.0)
Hemoglobin Other: 0 %
Hgb A2 Quant: 3.2 % (ref 2.2–3.2)
Hgb A: 56.4 % — ABNORMAL LOW (ref 96.8–97.8)
Hgb S Quant: 40.4 % — ABNORMAL HIGH

## 2013-06-15 ENCOUNTER — Encounter: Payer: Self-pay | Admitting: Obstetrics & Gynecology

## 2013-06-15 ENCOUNTER — Telehealth: Payer: Self-pay | Admitting: General Practice

## 2013-06-15 DIAGNOSIS — O0930 Supervision of pregnancy with insufficient antenatal care, unspecified trimester: Secondary | ICD-10-CM

## 2013-06-15 NOTE — Telephone Encounter (Signed)
Message copied by Kathee DeltonHILLMAN, CARRIE L on Tue Jun 15, 2013 11:10 AM ------      Message from: Jaynie CollinsANYANWU, UGONNA A      Created: Tue Jun 15, 2013 11:05 AM       Please ensure that anatomy scan has been scheduled for patient.  Was ordered last visit.  Thank you! ------

## 2013-06-15 NOTE — Telephone Encounter (Signed)
Ultrasound scheduled for 5/15 @ 10:30. Called patient and informed her of appt. Patient verbalized understanding and stated she lost her mucous plug this morning. Told patient we do not do anything for that so she doesn't need to be concerned about that because it's not an indication of labor. Patient verbalized understanding and had no further questions

## 2013-06-16 LAB — OPIATES/OPIOIDS (LC/MS-MS)
Codeine Urine: NEGATIVE ng/mL (ref <50)
HYDROMORPHONE: NEGATIVE ng/mL (ref <50)
Hydrocodone: NEGATIVE ng/mL (ref <50)
MORPHINE: NEGATIVE ng/mL (ref <50)
NORHYDROCODONE, UR: NEGATIVE ng/mL (ref <50)
Noroxycodone, Ur: NEGATIVE ng/mL (ref <50)
OXYCODONE, UR: NEGATIVE ng/mL (ref <50)
Oxymorphone: NEGATIVE ng/mL (ref <50)

## 2013-06-16 LAB — CANNABANOIDS (GC/LC/MS), URINE: THC-COOH (GC/LC/MS), ur confirm: 199 ng/mL — AB (ref <5)

## 2013-06-17 ENCOUNTER — Ambulatory Visit (INDEPENDENT_AMBULATORY_CARE_PROVIDER_SITE_OTHER): Payer: Medicaid Other | Admitting: Family Medicine

## 2013-06-17 ENCOUNTER — Encounter: Payer: Self-pay | Admitting: Obstetrics & Gynecology

## 2013-06-17 VITALS — BP 106/66 | HR 75 | Wt 133.6 lb

## 2013-06-17 DIAGNOSIS — F1210 Cannabis abuse, uncomplicated: Secondary | ICD-10-CM

## 2013-06-17 DIAGNOSIS — F1290 Cannabis use, unspecified, uncomplicated: Secondary | ICD-10-CM | POA: Insufficient documentation

## 2013-06-17 DIAGNOSIS — O09899 Supervision of other high risk pregnancies, unspecified trimester: Secondary | ICD-10-CM

## 2013-06-17 DIAGNOSIS — O99113 Other diseases of the blood and blood-forming organs and certain disorders involving the immune mechanism complicating pregnancy, third trimester: Principal | ICD-10-CM

## 2013-06-17 DIAGNOSIS — O99820 Streptococcus B carrier state complicating pregnancy: Secondary | ICD-10-CM

## 2013-06-17 DIAGNOSIS — O360990 Maternal care for other rhesus isoimmunization, unspecified trimester, not applicable or unspecified: Secondary | ICD-10-CM

## 2013-06-17 DIAGNOSIS — O0930 Supervision of pregnancy with insufficient antenatal care, unspecified trimester: Secondary | ICD-10-CM

## 2013-06-17 DIAGNOSIS — D689 Coagulation defect, unspecified: Secondary | ICD-10-CM

## 2013-06-17 DIAGNOSIS — O26899 Other specified pregnancy related conditions, unspecified trimester: Secondary | ICD-10-CM

## 2013-06-17 DIAGNOSIS — D6959 Other secondary thrombocytopenia: Secondary | ICD-10-CM

## 2013-06-17 DIAGNOSIS — Z6791 Unspecified blood type, Rh negative: Secondary | ICD-10-CM

## 2013-06-17 DIAGNOSIS — Z22330 Carrier of Group B streptococcus: Secondary | ICD-10-CM

## 2013-06-17 DIAGNOSIS — O99119 Other diseases of the blood and blood-forming organs and certain disorders involving the immune mechanism complicating pregnancy, unspecified trimester: Secondary | ICD-10-CM

## 2013-06-17 LAB — PRESCRIPTION MONITORING PROFILE (19 PANEL)
AMPHETAMINE/METH: NEGATIVE ng/mL
BUPRENORPHINE, URINE: NEGATIVE ng/mL
Barbiturate Screen, Urine: NEGATIVE ng/mL
Benzodiazepine Screen, Urine: NEGATIVE ng/mL
CARISOPRODOL, URINE: NEGATIVE ng/mL
COCAINE METABOLITES: NEGATIVE ng/mL
CREATININE, URINE: 58.99 mg/dL (ref >20.0)
ECSTASY: NEGATIVE ng/mL
FENTANYL URINE: NEGATIVE ng/mL
MEPERIDINE UR: NEGATIVE ng/mL
METHADONE SCREEN, URINE: NEGATIVE ng/mL
METHAQUALONE SCREEN (URINE): NEGATIVE ng/mL
Nitrites, Initial: NEGATIVE ug/mL
OXYCODONE SCRN UR: NEGATIVE ng/mL
PHENCYCLIDINE, UR: NEGATIVE ng/mL
Propoxyphene: NEGATIVE ng/mL
Tapentadol, urine: NEGATIVE ng/mL
Tramadol Scrn, Ur: NEGATIVE ng/mL
Zolpidem, Urine: NEGATIVE ng/mL
pH, Initial: 7.4 pH (ref 4.5–8.9)

## 2013-06-17 NOTE — Patient Instructions (Signed)
Third Trimester of Pregnancy  The third trimester is from week 29 through week 42, months 7 through 9. The third trimester is a time when the fetus is growing rapidly. At the end of the ninth month, the fetus is about 20 inches in length and weighs 6 10 pounds.   BODY CHANGES  Your body goes through many changes during pregnancy. The changes vary from woman to woman.    Your weight will continue to increase. You can expect to gain 25 35 pounds (11 16 kg) by the end of the pregnancy.   You may begin to get stretch marks on your hips, abdomen, and breasts.   You may urinate more often because the fetus is moving lower into your pelvis and pressing on your bladder.   You may develop or continue to have heartburn as a result of your pregnancy.   You may develop constipation because certain hormones are causing the muscles that push waste through your intestines to slow down.   You may develop hemorrhoids or swollen, bulging veins (varicose veins).   You may have pelvic pain because of the weight gain and pregnancy hormones relaxing your joints between the bones in your pelvis. Back aches may result from over exertion of the muscles supporting your posture.   Your breasts will continue to grow and be tender. A yellow discharge may leak from your breasts called colostrum.   Your belly button may stick out.   You may feel short of breath because of your expanding uterus.   You may notice the fetus "dropping," or moving lower in your abdomen.   You may have a bloody mucus discharge. This usually occurs a few days to a week before labor begins.   Your cervix becomes thin and soft (effaced) near your due date.  WHAT TO EXPECT AT YOUR PRENATAL EXAMS   You will have prenatal exams every 2 weeks until week 36. Then, you will have weekly prenatal exams. During a routine prenatal visit:   You will be weighed to make sure you and the fetus are growing normally.   Your blood pressure is taken.   Your abdomen will be  measured to track your baby's growth.   The fetal heartbeat will be listened to.   Any test results from the previous visit will be discussed.   You may have a cervical check near your due date to see if you have effaced.  At around 36 weeks, your caregiver will check your cervix. At the same time, your caregiver will also perform a test on the secretions of the vaginal tissue. This test is to determine if a type of bacteria, Group B streptococcus, is present. Your caregiver will explain this further.  Your caregiver may ask you:   What your birth plan is.   How you are feeling.   If you are feeling the baby move.   If you have had any abnormal symptoms, such as leaking fluid, bleeding, severe headaches, or abdominal cramping.   If you have any questions.  Other tests or screenings that may be performed during your third trimester include:   Blood tests that check for low iron levels (anemia).   Fetal testing to check the health, activity level, and growth of the fetus. Testing is done if you have certain medical conditions or if there are problems during the pregnancy.  FALSE LABOR  You may feel small, irregular contractions that eventually go away. These are called Braxton Hicks contractions, or   false labor. Contractions may last for hours, days, or even weeks before true labor sets in. If contractions come at regular intervals, intensify, or become painful, it is best to be seen by your caregiver.   SIGNS OF LABOR    Menstrual-like cramps.   Contractions that are 5 minutes apart or less.   Contractions that start on the top of the uterus and spread down to the lower abdomen and back.   A sense of increased pelvic pressure or back pain.   A watery or bloody mucus discharge that comes from the vagina.  If you have any of these signs before the 37th week of pregnancy, call your caregiver right away. You need to go to the hospital to get checked immediately.  HOME CARE INSTRUCTIONS    Avoid all  smoking, herbs, alcohol, and unprescribed drugs. These chemicals affect the formation and growth of the baby.   Follow your caregiver's instructions regarding medicine use. There are medicines that are either safe or unsafe to take during pregnancy.   Exercise only as directed by your caregiver. Experiencing uterine cramps is a good sign to stop exercising.   Continue to eat regular, healthy meals.   Wear a good support bra for breast tenderness.   Do not use hot tubs, steam rooms, or saunas.   Wear your seat belt at all times when driving.   Avoid raw meat, uncooked cheese, cat litter boxes, and soil used by cats. These carry germs that can cause birth defects in the baby.   Take your prenatal vitamins.   Try taking a stool softener (if your caregiver approves) if you develop constipation. Eat more high-fiber foods, such as fresh vegetables or fruit and whole grains. Drink plenty of fluids to keep your urine clear or pale yellow.   Take warm sitz baths to soothe any pain or discomfort caused by hemorrhoids. Use hemorrhoid cream if your caregiver approves.   If you develop varicose veins, wear support hose. Elevate your feet for 15 minutes, 3 4 times a day. Limit salt in your diet.   Avoid heavy lifting, wear low heal shoes, and practice good posture.   Rest a lot with your legs elevated if you have leg cramps or low back pain.   Visit your dentist if you have not gone during your pregnancy. Use a soft toothbrush to brush your teeth and be gentle when you floss.   A sexual relationship may be continued unless your caregiver directs you otherwise.   Do not travel far distances unless it is absolutely necessary and only with the approval of your caregiver.   Take prenatal classes to understand, practice, and ask questions about the labor and delivery.   Make a trial run to the hospital.   Pack your hospital bag.   Prepare the baby's nursery.   Continue to go to all your prenatal visits as directed  by your caregiver.  SEEK MEDICAL CARE IF:   You are unsure if you are in labor or if your water has broken.   You have dizziness.   You have mild pelvic cramps, pelvic pressure, or nagging pain in your abdominal area.   You have persistent nausea, vomiting, or diarrhea.   You have a bad smelling vaginal discharge.   You have pain with urination.  SEEK IMMEDIATE MEDICAL CARE IF:    You have a fever.   You are leaking fluid from your vagina.   You have spotting or bleeding from your vagina.     You have severe abdominal cramping or pain.   You have rapid weight loss or gain.   You have shortness of breath with chest pain.   You notice sudden or extreme swelling of your face, hands, ankles, feet, or legs.   You have not felt your baby move in over an hour.   You have severe headaches that do not go away with medicine.   You have vision changes.  Document Released: 01/15/2001 Document Revised: 09/23/2012 Document Reviewed: 03/24/2012  [employer] Patient Information 2014 [employer].

## 2013-06-17 NOTE — Progress Notes (Signed)
Patient has nasal congestion.  Thinks that she lost mucus plug this week. Has some non painful contractions

## 2013-06-17 NOTE — Progress Notes (Signed)
S: 23 yo G2p1001 @ 7533w6d here for robv.  - doing well. Some contractions but not regular - +Fm - no lof, vb.   O: see flowsheet  A/P - doing well - ptl precautions discussed - f/u in 1 week  - US scheduled for tomorrow - labs reviewed and rhogam already given last visit

## 2013-06-18 ENCOUNTER — Ambulatory Visit (HOSPITAL_COMMUNITY)
Admission: RE | Admit: 2013-06-18 | Discharge: 2013-06-18 | Disposition: A | Discharge status: Home / Self Care | Payer: Medicaid Other | Source: Ambulatory Visit | Attending: Obstetrics & Gynecology | Admitting: Obstetrics & Gynecology

## 2013-06-18 DIAGNOSIS — O0930 Supervision of pregnancy with insufficient antenatal care, unspecified trimester: Secondary | ICD-10-CM | POA: Insufficient documentation

## 2013-06-18 DIAGNOSIS — Z3689 Encounter for other specified antenatal screening: Secondary | ICD-10-CM | POA: Insufficient documentation

## 2013-06-21 ENCOUNTER — Inpatient Hospital Stay (HOSPITAL_COMMUNITY)
Admission: AD | Admit: 2013-06-21 | Discharge: 2013-06-24 | DRG: 775 | Disposition: A | Discharge status: Home / Self Care | Payer: Medicaid Other | Source: Ambulatory Visit | Attending: Obstetrics & Gynecology | Admitting: Obstetrics & Gynecology

## 2013-06-21 ENCOUNTER — Encounter (HOSPITAL_COMMUNITY): Payer: Self-pay

## 2013-06-21 DIAGNOSIS — D6959 Other secondary thrombocytopenia: Secondary | ICD-10-CM

## 2013-06-21 DIAGNOSIS — O9989 Other specified diseases and conditions complicating pregnancy, childbirth and the puerperium: Secondary | ICD-10-CM

## 2013-06-21 DIAGNOSIS — Z825 Family history of asthma and other chronic lower respiratory diseases: Secondary | ICD-10-CM

## 2013-06-21 DIAGNOSIS — O99892 Other specified diseases and conditions complicating childbirth: Secondary | ICD-10-CM | POA: Diagnosis present

## 2013-06-21 DIAGNOSIS — O99820 Streptococcus B carrier state complicating pregnancy: Secondary | ICD-10-CM

## 2013-06-21 DIAGNOSIS — IMO0001 Reserved for inherently not codable concepts without codable children: Secondary | ICD-10-CM

## 2013-06-21 DIAGNOSIS — O26899 Other specified pregnancy related conditions, unspecified trimester: Secondary | ICD-10-CM

## 2013-06-21 DIAGNOSIS — O99113 Other diseases of the blood and blood-forming organs and certain disorders involving the immune mechanism complicating pregnancy, third trimester: Secondary | ICD-10-CM

## 2013-06-21 DIAGNOSIS — Z22330 Carrier of Group B streptococcus: Secondary | ICD-10-CM

## 2013-06-21 DIAGNOSIS — Z6791 Unspecified blood type, Rh negative: Secondary | ICD-10-CM

## 2013-06-21 DIAGNOSIS — O0930 Supervision of pregnancy with insufficient antenatal care, unspecified trimester: Secondary | ICD-10-CM

## 2013-06-21 DIAGNOSIS — Z808 Family history of malignant neoplasm of other organs or systems: Secondary | ICD-10-CM

## 2013-06-21 DIAGNOSIS — O4290 Premature rupture of membranes, unspecified as to length of time between rupture and onset of labor, unspecified weeks of gestation: Principal | ICD-10-CM | POA: Diagnosis present

## 2013-06-21 LAB — CBC
HCT: 32.5 % — ABNORMAL LOW (ref 36.0–46.0)
HEMOGLOBIN: 11.4 g/dL — AB (ref 12.0–15.0)
MCH: 32 pg (ref 26.0–34.0)
MCHC: 35.1 g/dL (ref 30.0–36.0)
MCV: 91.3 fL (ref 78.0–100.0)
Platelets: 174 10*3/uL (ref 150–400)
RBC: 3.56 MIL/uL — AB (ref 3.87–5.11)
RDW: 13.4 % (ref 11.5–15.5)
WBC: 10.8 10*3/uL — ABNORMAL HIGH (ref 4.0–10.5)

## 2013-06-21 LAB — POCT FERN TEST: POCT Fern Test: POSITIVE

## 2013-06-21 LAB — RPR

## 2013-06-21 MED ORDER — IBUPROFEN 600 MG PO TABS: 600.0000 mg | ORAL_TABLET | Freq: Four times a day (QID) | ORAL | Status: DC | PRN

## 2013-06-21 MED ORDER — OXYTOCIN BOLUS FROM INFUSION
500.0000 mL | INTRAVENOUS | Status: DC
  Administered 2013-06-22: 500 mL via INTRAVENOUS

## 2013-06-21 MED ORDER — TERBUTALINE SULFATE 1 MG/ML IJ SOLN: 0.2500 mg | Freq: Once | INTRAMUSCULAR | Status: AC | PRN

## 2013-06-21 MED ORDER — OXYTOCIN 40 UNITS IN LACTATED RINGERS INFUSION - SIMPLE MED: 62.5000 mL/h | INTRAVENOUS | Status: DC

## 2013-06-21 MED ORDER — LACTATED RINGERS IV SOLN
INTRAVENOUS | Status: DC
  Administered 2013-06-21: 19:00:00 via INTRAVENOUS

## 2013-06-21 MED ORDER — FENTANYL CITRATE 0.05 MG/ML IJ SOLN
100.0000 ug | INTRAMUSCULAR | Status: DC | PRN
  Administered 2013-06-21 – 2013-06-22 (×3): 100 ug via INTRAVENOUS
  Filled 2013-06-21 (×3): qty 2

## 2013-06-21 MED ORDER — PENICILLIN G POTASSIUM 5000000 UNITS IJ SOLR
5.0000 10*6.[IU] | Freq: Once | INTRAVENOUS | Status: AC
  Administered 2013-06-21: 5 10*6.[IU] via INTRAVENOUS
  Filled 2013-06-21: qty 5

## 2013-06-21 MED ORDER — LIDOCAINE HCL (PF) 1 % IJ SOLN
30.0000 mL | INTRAMUSCULAR | Status: DC | PRN
  Filled 2013-06-21: qty 30

## 2013-06-21 MED ORDER — CITRIC ACID-SODIUM CITRATE 334-500 MG/5ML PO SOLN: 30.0000 mL | ORAL | Status: DC | PRN

## 2013-06-21 MED ORDER — ONDANSETRON HCL 4 MG/2ML IJ SOLN
4.0000 mg | Freq: Four times a day (QID) | INTRAMUSCULAR | Status: DC | PRN
  Administered 2013-06-22: 4 mg via INTRAVENOUS
  Filled 2013-06-21: qty 2

## 2013-06-21 MED ORDER — LACTATED RINGERS IV SOLN
500.0000 mL | INTRAVENOUS | Status: DC | PRN
Start: 2013-06-21 — End: 2013-06-22

## 2013-06-21 MED ORDER — PENICILLIN G POTASSIUM 5000000 UNITS IJ SOLR
2.5000 10*6.[IU] | INTRAMUSCULAR | Status: DC
  Administered 2013-06-22: 2.5 10*6.[IU] via INTRAVENOUS
  Filled 2013-06-21 (×5): qty 2.5

## 2013-06-21 MED ORDER — ACETAMINOPHEN 325 MG PO TABS: 650.0000 mg | ORAL_TABLET | ORAL | Status: DC | PRN

## 2013-06-21 MED ORDER — OXYCODONE-ACETAMINOPHEN 5-325 MG PO TABS: 1.0000 | ORAL_TABLET | ORAL | Status: DC | PRN

## 2013-06-21 MED ORDER — OXYTOCIN 40 UNITS IN LACTATED RINGERS INFUSION - SIMPLE MED
1.0000 m[IU]/min | INTRAVENOUS | Status: DC
  Administered 2013-06-21: 2 m[IU]/min via INTRAVENOUS
  Filled 2013-06-21: qty 1000

## 2013-06-21 NOTE — H&P (Signed)
Heidi Hinton is a 23 y.o. female G2P1001 with IUP at 3927w3d presenting for PROM at noon on 5/17. Pt has been wearing a diaper and wanted to be evaluated for ROM. Pt states she has been having regular, every 5 minutes contractions, associated with none vaginal bleeding.  Membranes are ruptured, clear fluid, with active fetal movement.   [facility]  Prenatal History/Complications: Very late to care GBS+ MJ use in pregnancy Thrombocytopenia resolved  Past Medical History: Past Medical History  Diagnosis Date  . Anemia   . Rh negative status during pregnancy 04/2011  . LSIL (low grade squamous intraepithelial lesion) on Pap smear 03/11/2011  . Late prenatal care     Past Surgical History: Past Surgical History  Procedure Laterality Date  . Wisdom tooth extraction  2009    Obstetrical History: OB History   Grav Para Term Preterm Abortions TAB SAB Ect Mult Living   2 1 1  0 0 0 0 0 0 1      Gynecological History: OB History   Grav Para Term Preterm Abortions TAB SAB Ect Mult Living   2 1 1  0 0 0 0 0 0 1      Social History: History   Social History  . Marital Status: Single    Spouse Name: N/A    Number of Children: N/A  . Years of Education: N/A   Social History Main Topics  . Smoking status: Never Smoker   . Smokeless tobacco: Never Used  . Alcohol Use: No  . Drug Use: No  . Sexual Activity: Yes    Birth Control/ Protection: None   Other Topics Concern  . Not on file   Social History Narrative  . No narrative on file    Family History: Family History  Problem Relation Age of Onset  . Alcohol abuse Mother   . Asthma Brother   . Cancer Brother     osteosarcoma  . Cancer Maternal Grandmother   . Mental illness Maternal Grandmother   . Alzheimer's disease Maternal Grandmother   . Anesthesia problems Neg Hx   . Hypotension Neg Hx   . Malignant hyperthermia Neg Hx   . Pseudochol deficiency Neg Hx     Allergies: No Known  Allergies  Facility-administered medications prior to admission  Medication Dose Route Frequency Provider Last Rate Last Dose  . Tdap (BOOSTRIX) injection 0.5 mL  0.5 mL Intramuscular Once Tereso NewcomerUgonna A Anyanwu, MD       Prescriptions prior to admission  Medication Sig Dispense Refill  . Prenatal Vit-Fe Fumarate-FA (PRENATAL MULTIVITAMIN) TABS tablet Take 1 tablet by mouth daily at 12 noon.         Review of Systems   Constitutional: no complaints, +FM, +LOF, no vb, no ctx  Blood pressure 123/59, pulse 73, temperature 98.4 F (36.9 C), temperature source Oral, resp. rate 16, height 5' 6.5" (1.689 m), weight 62.869 kg (138 lb 9.6 oz), last menstrual period 06/29/2012, SpO2 100.00%. General appearance: alert, cooperative, appears stated age and no distress Lungs: clear to auscultation bilaterally Heart: regular rate and rhythm Abdomen: soft, non-tender; bowel sounds normal, leopold 5.5lbs Pelvic: adequate Extremities: Homans sign is negative, no sign of DVT DTR's 1+ Presentation: cephalic Fetal monitoringBaseline: 130s bpm, Variability: Good {> 6 bpm), Accelerations: Reactive and Decelerations: Absent Uterine activity irregular >6135m Dilation: 2 Effacement (%): Thick Station: -3 Exam by:: Dr. Ike Benedom, Dr. Noreene FilbertSchmidt   Prenatal labs: ABO, Rh: O/NEG/-- (05/07 0920) Antibody: NEG (05/07 0920)  Rubella:   RPR: NON REAC (05/07 0920)  HBsAg: NEGATIVE (05/07 0920)  HIV: NONREACTIVE (05/07 0920)  GBS:      [facility]  Dating 14 week scan   Genetic Screen Too late  Anatomic US   GTT 95  TDaP vaccine 06/10/2013  GBS Positive  Contraception Depo Provera  Baby Food Breast/bottle  Circumcision Yes  Pediatrician [facility]  Support Person FOB       Prenatal Transfer Tool  Maternal Diabetes: No Genetic Screening: not perfomred Maternal Ultrasounds/Referrals: NMLlimited 3rd trim Fetal Ultrasounds or other Referrals:  None Maternal Substance Abuse:  Yes:  Type:  Marijuana Significant Maternal Medications:  None Significant Maternal Lab Results: Lab values include: Group B Strep positive     Results for orders placed during the hospital encounter of 06/21/13 (from the past 24 hour(s))  POCT FERN TEST   Collection Time    06/21/13  6:35 PM      Result Value Ref Range   POCT Fern Test Positive = ruptured amniotic membanes      Assessment: Heidi Hinton is a 23 y.o. G2P1001 at 5159w3d by R=14 here for PROM #Labor: pt without contractions at this time with ROM  #Pain: Epidrual and IV pain meds PRN #FWB:  Cat I #ID:  GBS+, start PCN #MOF: Breast #MOC: depo #Circ:  Outpatient #SW consult: limited Prenatal care  Minta BalsamMichael R Lewanna Petrak 06/21/2013, 6:37 PM

## 2013-06-21 NOTE — MAU Note (Signed)
Patient states she started leaking clear fluid at 1200 5-17. States she is having contractions every 5 minutes. Reports good fetal movement. Denies bleeding.

## 2013-06-21 NOTE — H&P (Signed)
Attestation of Attending Supervision of Fellow: Evaluation and management procedures were performed by the Fellow under my supervision and collaboration.  I have reviewed the Fellow's note and chart, and I agree with the management and plan.    

## 2013-06-22 ENCOUNTER — Encounter (HOSPITAL_COMMUNITY): Payer: Medicaid Other | Admitting: Anesthesiology

## 2013-06-22 ENCOUNTER — Inpatient Hospital Stay (HOSPITAL_COMMUNITY): Payer: Medicaid Other | Admitting: Anesthesiology

## 2013-06-22 ENCOUNTER — Encounter (HOSPITAL_COMMUNITY): Payer: Self-pay

## 2013-06-22 ENCOUNTER — Encounter: Payer: Self-pay | Admitting: Obstetrics & Gynecology

## 2013-06-22 DIAGNOSIS — O4290 Premature rupture of membranes, unspecified as to length of time between rupture and onset of labor, unspecified weeks of gestation: Secondary | ICD-10-CM

## 2013-06-22 MED ORDER — ONDANSETRON HCL 4 MG PO TABS: 4.0000 mg | ORAL_TABLET | ORAL | Status: DC | PRN

## 2013-06-22 MED ORDER — DIPHENHYDRAMINE HCL 50 MG/ML IJ SOLN: 12.5000 mg | INTRAMUSCULAR | Status: DC | PRN

## 2013-06-22 MED ORDER — EPHEDRINE 5 MG/ML INJ
10.0000 mg | INTRAVENOUS | Status: DC | PRN
Start: 2013-06-22 — End: 2013-06-22
  Filled 2013-06-22: qty 2

## 2013-06-22 MED ORDER — FENTANYL 2.5 MCG/ML BUPIVACAINE 1/10 % EPIDURAL INFUSION (WH - ANES)
INTRAMUSCULAR | Status: DC | PRN
  Administered 2013-06-22: 14 mL/h via EPIDURAL

## 2013-06-22 MED ORDER — PHENYLEPHRINE 40 MCG/ML (10ML) SYRINGE FOR IV PUSH (FOR BLOOD PRESSURE SUPPORT)
80.0000 ug | PREFILLED_SYRINGE | INTRAVENOUS | Status: DC | PRN
  Filled 2013-06-22: qty 2

## 2013-06-22 MED ORDER — ONDANSETRON HCL 4 MG/2ML IJ SOLN
4.0000 mg | INTRAMUSCULAR | Status: DC | PRN
Start: 2013-06-22 — End: 2013-06-24

## 2013-06-22 MED ORDER — BENZOCAINE-MENTHOL 20-0.5 % EX AERO
1.0000 | INHALATION_SPRAY | CUTANEOUS | Status: DC | PRN
Start: 2013-06-22 — End: 2013-06-24
  Administered 2013-06-22: 1 via TOPICAL
  Filled 2013-06-22 (×2): qty 56

## 2013-06-22 MED ORDER — SENNOSIDES-DOCUSATE SODIUM 8.6-50 MG PO TABS
2.0000 | ORAL_TABLET | ORAL | Status: DC
  Administered 2013-06-22 – 2013-06-23 (×2): 2 via ORAL
  Filled 2013-06-22 (×2): qty 2

## 2013-06-22 MED ORDER — DIPHENHYDRAMINE HCL 25 MG PO CAPS: 25.0000 mg | ORAL_CAPSULE | Freq: Four times a day (QID) | ORAL | Status: DC | PRN

## 2013-06-22 MED ORDER — LANOLIN HYDROUS EX OINT: TOPICAL_OINTMENT | CUTANEOUS | Status: DC | PRN

## 2013-06-22 MED ORDER — IBUPROFEN 600 MG PO TABS
600.0000 mg | ORAL_TABLET | Freq: Four times a day (QID) | ORAL | Status: DC
  Administered 2013-06-22 – 2013-06-24 (×8): 600 mg via ORAL
  Filled 2013-06-22 (×9): qty 1

## 2013-06-22 MED ORDER — WITCH HAZEL-GLYCERIN EX PADS: 1.0000 "application " | MEDICATED_PAD | CUTANEOUS | Status: DC | PRN

## 2013-06-22 MED ORDER — LACTATED RINGERS IV SOLN: 500.0000 mL | Freq: Once | INTRAVENOUS | Status: DC

## 2013-06-22 MED ORDER — EPHEDRINE 5 MG/ML INJ
10.0000 mg | INTRAVENOUS | Status: DC | PRN
Start: 2013-06-22 — End: 2013-06-22
  Filled 2013-06-22: qty 2
  Filled 2013-06-22: qty 4

## 2013-06-22 MED ORDER — PHENYLEPHRINE 40 MCG/ML (10ML) SYRINGE FOR IV PUSH (FOR BLOOD PRESSURE SUPPORT)
80.0000 ug | PREFILLED_SYRINGE | INTRAVENOUS | Status: DC | PRN
  Filled 2013-06-22: qty 10
  Filled 2013-06-22: qty 2

## 2013-06-22 MED ORDER — PRENATAL MULTIVITAMIN CH
1.0000 | ORAL_TABLET | Freq: Every day | ORAL | Status: DC
  Administered 2013-06-23 – 2013-06-24 (×2): 1 via ORAL
  Filled 2013-06-22 (×2): qty 1

## 2013-06-22 MED ORDER — SIMETHICONE 80 MG PO CHEW: 80.0000 mg | CHEWABLE_TABLET | ORAL | Status: DC | PRN

## 2013-06-22 MED ORDER — DIBUCAINE 1 % RE OINT: 1.0000 "application " | TOPICAL_OINTMENT | RECTAL | Status: DC | PRN

## 2013-06-22 MED ORDER — OXYCODONE-ACETAMINOPHEN 5-325 MG PO TABS
1.0000 | ORAL_TABLET | ORAL | Status: DC | PRN
  Administered 2013-06-23 – 2013-06-24 (×2): 1 via ORAL
  Filled 2013-06-22 (×2): qty 1

## 2013-06-22 MED ORDER — ZOLPIDEM TARTRATE 5 MG PO TABS: 5.0000 mg | ORAL_TABLET | Freq: Every evening | ORAL | Status: DC | PRN

## 2013-06-22 MED ORDER — FENTANYL 2.5 MCG/ML BUPIVACAINE 1/10 % EPIDURAL INFUSION (WH - ANES)
14.0000 mL/h | INTRAMUSCULAR | Status: DC | PRN
  Filled 2013-06-22: qty 125

## 2013-06-22 MED ORDER — LIDOCAINE HCL (PF) 1 % IJ SOLN
INTRAMUSCULAR | Status: DC | PRN
  Administered 2013-06-22 (×2): 5 mL

## 2013-06-22 NOTE — Progress Notes (Signed)
CSW attempted to meet with pt however several visitors were present. CSW will return at a later time to assess reason for [facility] & MJ use.

## 2013-06-22 NOTE — Progress Notes (Signed)
Post Partum Day 0 Subjective: no complaints and still numb from epidural. Minimal pain and bleeding.  Objective: Blood pressure 113/59, pulse 54, temperature 98.6 F (37 C), temperature source Oral, resp. rate 18, height 5' 6.5" (1.689 m), weight 62.596 kg (138 lb), last menstrual period 06/29/2012, SpO2 99.00%, unknown if currently breastfeeding.  Physical Exam:  General: alert, cooperative and no distress Lochia: appropriate Uterine Fundus: firm Incision: vaginal delivery DVT Evaluation: No evidence of DVT seen on physical exam. Negative Homan's sign. No cords or calf tenderness. No significant calf/ankle edema.   Recent Labs  06/21/13 1915  HGB 11.4*  HCT 32.5*    Assessment/Plan: Plan for discharge tomorrow Pt wants to do depo shots but has been educated on mirena and nexplanon and will consider them as alternatives before discharge tomorrow.   LOS: 1 day   Lawernce PittsDrew Janiesha Diehl 06/22/2013, 8:51 AM

## 2013-06-22 NOTE — Anesthesia Preprocedure Evaluation (Signed)
Anesthesia Evaluation  Patient identified by MRN, date of birth, ID band Patient awake    Reviewed: Allergy & Precautions, H&P , NPO status , Patient's Chart, lab work & pertinent test results  History of Anesthesia Complications Negative for: history of anesthetic complications  Airway Mallampati: II TM Distance: >3 FB Neck ROM: Full    Dental  (+) Teeth Intact   Pulmonary neg pulmonary ROS,          Cardiovascular negative cardio ROS  Rhythm:Regular     Neuro/Psych negative neurological ROS     GI/Hepatic negative GI ROS, Neg liver ROS,   Endo/Other  negative endocrine ROS  Renal/GU negative Renal ROS     Musculoskeletal   Abdominal   Peds  Hematology  (+) anemia ,   Anesthesia Other Findings   Reproductive/Obstetrics (+) Pregnancy                           Anesthesia Physical Anesthesia Plan  ASA: II  Anesthesia Plan: Epidural   Post-op Pain Management:    Induction:   Airway Management Planned: Natural Airway  Additional Equipment: None  Intra-op Plan:   Post-operative Plan:   Informed Consent: I have reviewed the patients History and Physical, chart, labs and discussed the procedure including the risks, benefits and alternatives for the proposed anesthesia with the patient or authorized representative who has indicated his/her understanding and acceptance.   Dental advisory given  Plan Discussed with: Anesthesiologist  Anesthesia Plan Comments:         Anesthesia Quick Evaluation  

## 2013-06-22 NOTE — Progress Notes (Signed)
Delivery of live viable female by Dr. Birdie SonsSonnenberg at [phone].

## 2013-06-22 NOTE — Progress Notes (Signed)
UR completed 

## 2013-06-22 NOTE — Anesthesia Postprocedure Evaluation (Signed)
Anesthesia Post Note  Patient: Heidi Hinton  Procedure(s) Performed: * No procedures listed *  Anesthesia type: Epidural  Patient location: Mother/Baby  Post pain: Pain level controlled  Post assessment: Post-op Vital signs reviewed  Last Vitals:  Filed Vitals:   06/22/13 1217  BP: 108/63  Pulse: 55  Temp: 36.7 C  Resp: 18    Post vital signs: Reviewed  Level of consciousness:alert  Complications: No apparent anesthesia complications

## 2013-06-22 NOTE — Anesthesia Procedure Notes (Signed)
Epidural Patient location during procedure: OB Start time: 06/22/2013 2:04 AM End time: 06/22/2013 2:12 AM  Staffing Anesthesiologist: Braedan Meuth, CHRIS Performed by: anesthesiologist   Preanesthetic Checklist Completed: patient identified, surgical consent, pre-op evaluation, timeout performed, IV checked, risks and benefits discussed and monitors and equipment checked  Epidural Patient position: sitting Prep: site prepped and draped and DuraPrep Patient monitoring: heart rate, cardiac monitor, continuous pulse ox and blood pressure Approach: midline Location: L3-L4 Injection technique: LOR saline  Needle:  Needle type: Tuohy  Needle gauge: 17 G Needle length: 9 cm Needle insertion depth: 5 cm Catheter type: closed end flexible Catheter size: 19 Gauge Catheter at skin depth: 12 cm Test dose: Other  Assessment Events: blood not aspirated, injection not painful, no injection resistance, negative IV test and no paresthesia  Additional Notes H+P and labs checked, risks and benefits discussed with the patient, consent obtained, procedure tolerated well and without complications.  Reason for block:procedure for pain

## 2013-06-22 NOTE — Lactation Note (Signed)
This note was copied from the chart of Heidi Leone HavenKaneisha Cawley. Lactation Consultation Note Mother states she wants to both breastfeed and formula feed. Did both with older son. Attempted to bf in cross cradle but baby sleepy at this time. Reviewed supply and demand, basics, Mom encouraged to feed baby 8-12 times/24 hours and with feeding cues.  Mom made aware of O/P services, breastfeeding support groups, community resources, and our phone # for post-discharge questions.   Patient Name: Heidi Leone HavenKaneisha Segal WUJWJ'XToday's Date: 06/22/2013 Reason for consult: Initial assessment   Maternal Data Has patient been taught Hand Expression?: Yes Does the patient have breastfeeding experience prior to this delivery?: Yes  Feeding Feeding Type: Formula Nipple Type: Slow - flow  LATCH Score/Interventions                      Lactation Tools Discussed/Used     Consult Status Consult Status: Follow-up Date: 06/23/13 Follow-up type: In-patient    Dulce SellarRuth Boschen Berkelhammer 06/22/2013, 11:23 AM

## 2013-06-22 NOTE — Progress Notes (Signed)
Leamon ArntKaneisha D Chap is a 23 y.o. G2P1001 at 6329w4d admitted for rupture of membranes  Subjective: Reports increased contraction intensity. Requesting epidural.  Objective: BP 107/54  Pulse 61  Temp(Src) 97.8 F (36.6 C) (Oral)  Resp 16  Ht 5' 6.5" (1.689 m)  Wt 62.596 kg (138 lb)  BMI 21.94 kg/m2  SpO2 100%  LMP 06/29/2012      FHT:  FHR: 125 bpm, variability: moderate,  accelerations:  Present,  decelerations:  Absent UC:   Every 2-4 minutes SVE:   Dilation: 4 Effacement (%): 80 Station: -2 Exam by:: Dr. Ike Benedom  Labs: Lab Results  Component Value Date   WBC 10.8* 06/21/2013   HGB 11.4* 06/21/2013   HCT 32.5* 06/21/2013   MCV 91.3 06/21/2013   PLT 174 06/21/2013    Assessment / Plan: Augmentation of labor, progressing well  Labor: Progressing normally Fetal Wellbeing:  Category I Pain Control:  Epidural, will have placed I/D:  n/a Anticipated MOD:  NSVD  Glori Luisric G Adeja Sarratt 06/22/2013, 1:23 AM

## 2013-06-23 MED ORDER — RHO D IMMUNE GLOBULIN 1500 UNIT/2ML IJ SOSY
300.0000 ug | PREFILLED_SYRINGE | Freq: Once | INTRAMUSCULAR | Status: AC
  Administered 2013-06-23: 300 ug via INTRAMUSCULAR
  Filled 2013-06-23: qty 2

## 2013-06-23 NOTE — Lactation Note (Signed)
This note was copied from the chart of Heidi Heidi HavenKaneisha Cormier. Lactation Consultation Note  Patient Name: Heidi Hinton ZOXWR'UToday's Date: 06/23/2013 Reason for consult: Follow-up assessment  Infant has only had one [facility] documented since birth [facility]  All bottles of formula since then; voids-3; stools-2.  LC went to visit mom to verify her desire to bottle feed.  Asked if she needed any assistance with breastfeeding and she denied needing assistance; mom stated that she "knew everything about breastfeeding" and said she breastfed her previous child for 6 months.  Reviewed with mom supply/ demand for milk production.  Encouraged mom to call for assistance from staff if she wants help with breastfeeding.    Consult Status Consult Status: Complete    Lendon KaStephanie Walker Sekou Zuckerman 06/23/2013, 3:35 PM

## 2013-06-23 NOTE — Discharge Summary (Signed)
Obstetric Discharge Summary Reason for Admission: PROM Prenatal Procedures: none Intrapartum Procedures: spontaneous vaginal delivery and GBS prophylaxis Postpartum Procedures: none Complications-Operative and Postpartum: none Hemoglobin  Date Value Ref Range Status  06/21/2013 11.4* 12.0 - 15.0 g/dL Final     HCT  Date Value Ref Range Status  06/21/2013 32.5* 36.0 - 46.0 % Final    Discharge Diagnoses: Term Pregnancy-delivered and PROM x24 hours  Hospital Course:  Heidi Hinton is a 23 y.o. [id number] who presented to the MAU with PROM and onset of labor on 06/21/13.  She wast treated prophylactically for GBS and had a uncomplicated SVD. She was able to ambulate, tolerate PO and void normally. She was discharged home with instructions for postpartum care.    Delivery Note  At 4:19 AM a viable female was delivered via Vaginal, Spontaneous Delivery (Presentation: Right Occiput Anterior). APGAR: 9, 9; weight pending.  Called to delivery. Patient admitted for PROM. Delivery augmented with pitocin. Patient GBS positive and received PCN. Infant delivered to maternal abdomen. Cord clamped and cut. Active management of 3rd stage with traction and Pitocin. Placenta delivered intact with 3v cord. Counts correct. Hemostatic.  Anesthesia: Epidural  Episiotomy: None  Lacerations: Right periurethral  Suture Repair: 4.0 vicryl  Est. Blood Loss (mL): 100  Mom to postpartum. Baby to Couplet care / Skin to Skin.   Glori Luisric G Sonnenberg  06/22/2013, 4:51 AM   I was present for the entirety of this delivery and supervised resident with delivery and repair.  Tawana ScaleMichael Ryan Odom, MD  OB Fellow   Physical Exam:  General: alert, cooperative and no distress Lochia: appropriate Uterine Fundus: firm DVT Evaluation: No evidence of DVT seen on physical exam. Negative Homan's sign. No cords or calf tenderness. No significant calf/ankle edema.  Discharge Information: Date: 06/23/2013 Activity: pelvic  rest Diet: routine Medications: motrin and prenatal vitamin Baby feeding: plans to breastfeed, plans to bottle feed Contraception: Depo-Provera Condition: stable Instructions: refer to practice specific booklet Discharge to: home   Newborn Data: Live born female  Birth Weight: 5 lb 15.9 oz (2719 g) APGAR: 9, 9  Baby staying for hyperbilirubinemia. Will courtyard mother.  [name] PA-S2 06/23/2013, 8:58 AM   I have seen and examined this patient and agree with above documentation in the PA student's note. Pt ammenable to plan as above.   Rulon AbideKeli Kojo Liby, M.D. [facility] 06/23/2013 9:40 AM

## 2013-06-23 NOTE — Progress Notes (Signed)
I examined pt and agree with documentation above and resident plan of care. Alla Sloma N Muhammad, CNM  

## 2013-06-23 NOTE — Progress Notes (Signed)
Post Partum Day  Subjective: no complaints, up ad lib, voiding, tolerating PO and + flatus  Objective: Blood pressure 122/81, pulse 54, temperature 97.7 F (36.5 C), temperature source Oral, resp. rate 18, height 5' 6.5" (1.689 m), weight 62.596 kg (138 lb), last menstrual period 06/29/2012, SpO2 99.00%, unknown if currently breastfeeding.  Physical Exam:  General: alert, cooperative and no distress Lochia: appropriate Uterine Fundus: firm Incision: N/A DVT Evaluation: No evidence of DVT seen on physical exam. Negative Homan's sign. No cords or calf tenderness. No significant calf/ankle edema.   Recent Labs  06/21/13 1915  HGB 11.4*  HCT 32.5*    Assessment/Plan: Plan for discharge tomorrow  Baby staying for hyperbilirubinemia    LOS: 2 days   Heidi Hinton 06/23/2013, 8:01 AM   Please see separate d/c summary.   Heidi HavenKeli L Takyla Kuchera, MD
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
## 2013-06-24 LAB — RH IG WORKUP (INCLUDES ABO/RH)
ABO/RH(D): O NEG
ANTIBODY SCREEN: POSITIVE
DAT, IgG: NEGATIVE
FETAL SCREEN: NEGATIVE
GESTATIONAL AGE(WKS): 39.4
Unit division: 0

## 2013-06-24 MED ORDER — IBUPROFEN 600 MG PO TABS: 600.0000 mg | ORAL_TABLET | Freq: Four times a day (QID) | ORAL | Status: DC

## 2013-06-24 NOTE — Discharge Instructions (Signed)
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
## 2013-06-24 NOTE — Discharge Summary (Signed)
  Obstetric Discharge Summary  Reason for Admission: PROM  Prenatal Procedures: none  Intrapartum Procedures: spontaneous vaginal delivery and GBS prophylaxis  Postpartum Procedures: none  Complications-Operative and Postpartum: none  Hemoglobin   Date  Value  Ref Range  Status   06/21/2013  11.4*  12.0 - 15.0 g/dL  Final      HCT   Date  Value  Ref Range  Status   06/21/2013  32.5*  36.0 - 46.0 %  Final    Discharge Diagnoses: Term Pregnancy-delivered and PROM x24 hours  Hospital Course: Leamon ArntKaneisha D Heacox is a 23 y.o. [id number] who presented to the [facility]U with PROM and onset of labor on 06/21/13. She wast treated prophylactically for GBS and had a uncomplicated SVD. She was able to ambulate, tolerate PO and void normally. She was discharged home with instructions for postpartum care.   Delivery Note  At 4:19 AM a viable female was delivered via Vaginal, Spontaneous Delivery (Presentation: Right Occiput Anterior). APGAR: 9, 9; weight pending.  Called to delivery. Patient admitted for PROM. Delivery augmented with pitocin. Patient GBS positive and received PCN. Infant delivered to maternal abdomen. Cord clamped and cut. Active management of 3rd stage with traction and Pitocin. Placenta delivered intact with 3v cord. Counts correct. Hemostatic.  Anesthesia: Epidural  Episiotomy: None  Lacerations: Right periurethral  Suture Repair: 4.0 vicryl  Est. Blood Loss (mL): 100  Mom to postpartum. Baby to Couplet care / Skin to Skin.  Glori Luisric G Sonnenberg  06/22/2013, 4:51 AM  I was present for the entirety of this delivery and supervised resident with delivery and repair.  Tawana ScaleMichael Ryan Odom, MD  OB Fellow  Physical Exam:  General: alert, cooperative and no distress  Lochia: appropriate  Uterine Fundus: firm  DVT Evaluation: No evidence of DVT seen on physical exam.  Negative Homan's sign.  No cords or calf tenderness.  No significant calf/ankle edema.   Discharge Information:  Date: 06/24/2013   Activity: pelvic rest  Diet: routine  Medications: motrin and prenatal vitamin  Baby feeding: plans to breastfeed, plans to bottle feed  Contraception: Depo-Provera  Condition: stable  Instructions: refer to practice specific booklet  Discharge to: home   Newborn Data:  Live born female  Birth Weight: 5 lb 15.9 oz (2719 g)  APGAR: 9, 9  Baby staying for hyperbilirubinemia. Will courtyard mother.   [name] PA-S2  06/23/2013, 8:58 AM  I have seen and examined this patient and I agree with the above. Pt not discharged yesterday. Status & exam unchanged- will be discharged today. Arabella MerlesKimberly D Shaw 8:56 AM 06/24/2013

## 2013-06-28 NOTE — Discharge Summary (Signed)
`````  Attestation of Attending Supervision of Advanced Practitioner: Evaluation and management procedures were performed by the PA/NP/CNM/OB Fellow under my supervision/collaboration. Chart reviewed and agree with management and plan.  Tilda Burrow 06/28/2013 7:18 PM

## 2013-08-08 ENCOUNTER — Emergency Department (HOSPITAL_COMMUNITY)
Admission: EM | Admit: 2013-08-08 | Discharge: 2013-08-08 | Disposition: A | Discharge status: Home / Self Care | Payer: Medicaid Other | Attending: Emergency Medicine | Admitting: Emergency Medicine

## 2013-08-08 ENCOUNTER — Encounter (HOSPITAL_COMMUNITY): Payer: Self-pay | Admitting: Emergency Medicine

## 2013-08-08 ENCOUNTER — Emergency Department (HOSPITAL_COMMUNITY): Payer: Medicaid Other

## 2013-08-08 DIAGNOSIS — D649 Anemia, unspecified: Secondary | ICD-10-CM | POA: Diagnosis not present

## 2013-08-08 DIAGNOSIS — K5289 Other specified noninfective gastroenteritis and colitis: Secondary | ICD-10-CM | POA: Insufficient documentation

## 2013-08-08 DIAGNOSIS — Z79899 Other long term (current) drug therapy: Secondary | ICD-10-CM | POA: Diagnosis not present

## 2013-08-08 DIAGNOSIS — R109 Unspecified abdominal pain: Secondary | ICD-10-CM | POA: Diagnosis present

## 2013-08-08 DIAGNOSIS — K529 Noninfective gastroenteritis and colitis, unspecified: Secondary | ICD-10-CM

## 2013-08-08 LAB — CBC WITH DIFFERENTIAL/PLATELET
BASOS PCT: 0 % (ref 0–1)
Basophils Absolute: 0 10*3/uL (ref 0.0–0.1)
EOS ABS: 0.2 10*3/uL (ref 0.0–0.7)
EOS PCT: 3 % (ref 0–5)
HCT: 36.5 % (ref 36.0–46.0)
Hemoglobin: 12.7 g/dL (ref 12.0–15.0)
LYMPHS ABS: 1.9 10*3/uL (ref 0.7–4.0)
Lymphocytes Relative: 32 % (ref 12–46)
MCH: 30 pg (ref 26.0–34.0)
MCHC: 34.8 g/dL (ref 30.0–36.0)
MCV: 86.1 fL (ref 78.0–100.0)
Monocytes Absolute: 0.5 10*3/uL (ref 0.1–1.0)
Monocytes Relative: 8 % (ref 3–12)
NEUTROS PCT: 57 % (ref 43–77)
Neutro Abs: 3.4 10*3/uL (ref 1.7–7.7)
Platelets: 201 10*3/uL (ref 150–400)
RBC: 4.24 MIL/uL (ref 3.87–5.11)
RDW: 13 % (ref 11.5–15.5)
WBC: 5.9 10*3/uL (ref 4.0–10.5)

## 2013-08-08 LAB — COMPREHENSIVE METABOLIC PANEL
ALBUMIN: 4.1 g/dL (ref 3.5–5.2)
ALT: 9 U/L (ref 0–35)
ANION GAP: 10 (ref 5–15)
AST: 16 U/L (ref 0–37)
Alkaline Phosphatase: 74 U/L (ref 39–117)
BUN: 6 mg/dL (ref 6–23)
CALCIUM: 9.6 mg/dL (ref 8.4–10.5)
CO2: 28 mEq/L (ref 19–32)
Chloride: 103 mEq/L (ref 96–112)
Creatinine, Ser: 0.69 mg/dL (ref 0.50–1.10)
GFR calc Af Amer: >90 mL/min (ref >90)
GFR calc non Af Amer: >90 mL/min (ref >90)
GLUCOSE: 110 mg/dL — AB (ref 70–99)
POTASSIUM: 4 meq/L (ref 3.7–5.3)
Sodium: 141 mEq/L (ref 137–147)
TOTAL PROTEIN: 7.7 g/dL (ref 6.0–8.3)
Total Bilirubin: 0.4 mg/dL (ref 0.3–1.2)

## 2013-08-08 LAB — URINALYSIS, ROUTINE W REFLEX MICROSCOPIC
Bilirubin Urine: NEGATIVE
Glucose, UA: NEGATIVE mg/dL
Ketones, ur: NEGATIVE mg/dL
Leukocytes, UA: NEGATIVE
Nitrite: NEGATIVE
PROTEIN: NEGATIVE mg/dL
SPECIFIC GRAVITY, URINE: 1.016 (ref 1.005–1.030)
Urobilinogen, UA: 1 mg/dL (ref 0.0–1.0)
pH: 7 (ref 5.0–8.0)

## 2013-08-08 LAB — LIPASE, BLOOD: Lipase: 17 U/L (ref 11–59)

## 2013-08-08 LAB — URINE MICROSCOPIC-ADD ON: URINE-OTHER: NONE SEEN

## 2013-08-08 MED ORDER — SODIUM CHLORIDE 0.9 % IV BOLUS (SEPSIS)
1000.0000 mL | Freq: Once | INTRAVENOUS | Status: AC
  Administered 2013-08-08: 1000 mL via INTRAVENOUS

## 2013-08-08 MED ORDER — CIPROFLOXACIN HCL 500 MG PO TABS: 500.0000 mg | ORAL_TABLET | Freq: Two times a day (BID) | ORAL | Status: DC

## 2013-08-08 MED ORDER — METRONIDAZOLE 500 MG PO TABS: 500.0000 mg | ORAL_TABLET | Freq: Two times a day (BID) | ORAL | Status: DC

## 2013-08-08 NOTE — ED Provider Notes (Signed)
CSN: [id number]     Arrival date & time 08/08/13  1424 History   First MD Initiated Contact with Patient 08/08/13 1428     Chief Complaint  Patient presents with  . Flank Pain     (Consider location/radiation/quality/duration/timing/severity/associated sxs/prior Treatment) HPI Comments: Heidi Hinton is a 23 y.o. Female s/p SVD on 06/22/13 presenting today with R sided flank pain x 7 days, 10/10 crampy spasmy type pain, non-radiating. States splinting her side helps, and movement or palpation makes this worse. States she has had some relief from Ibuprofen 600mg . Denies fevers/chills, abd pain, N/V/D/C, hematochezia, melena, dysuria, hematuria, CP, SOB, myalgias, or arthralgias. Currently on her menses, but denies vaginal discharge or odors. Denies trauma or heavy lifting. Pt not currently breastfeeding.  Patient is a 23 y.o. female presenting with flank pain. The history is provided by the patient. No language interpreter was used.  Flank Pain This is a new problem. The current episode started in the past 7 days. The problem occurs constantly. The problem has been unchanged. Pertinent negatives include no abdominal pain, anorexia, arthralgias, change in bowel habit, chest pain, chills, coughing, fever, headaches, joint swelling, myalgias, nausea, neck pain, numbness, rash, sore throat, urinary symptoms, vomiting or weakness. The symptoms are aggravated by bending. She has tried NSAIDs for the symptoms. The treatment provided mild relief.    Past Medical History  Diagnosis Date  . Anemia   . Rh negative status during pregnancy 04/2011  . LSIL (low grade squamous intraepithelial lesion) on Pap smear 03/11/2011  . Late prenatal care    Past Surgical History  Procedure Laterality Date  . Wisdom tooth extraction  2009   Family History  Problem Relation Age of Onset  . Alcohol abuse Mother   . Asthma Brother   . Cancer Brother     osteosarcoma  . Cancer Maternal Grandmother   . Mental  illness Maternal Grandmother   . Alzheimer's disease Maternal Grandmother   . Anesthesia problems Neg Hx   . Hypotension Neg Hx   . Malignant hyperthermia Neg Hx   . Pseudochol deficiency Neg Hx    History  Substance Use Topics  . Smoking status: Never Smoker   . Smokeless tobacco: Never Used  . Alcohol Use: No   OB History   Grav Para Term Preterm Abortions TAB SAB Ect Mult Living   2 2 2  0 0 0 0 0 0 2     Review of Systems  Constitutional: Negative for fever and chills.  HENT: Negative for mouth sores and sore throat.   Respiratory: Negative for cough and shortness of breath.   Cardiovascular: Negative for chest pain.  Gastrointestinal: Negative for nausea, vomiting, abdominal pain, diarrhea, constipation, blood in stool, abdominal distention, anal bleeding, rectal pain, anorexia and change in bowel habit.  Genitourinary: Positive for flank pain and vaginal bleeding (currently on menses). Negative for dysuria, urgency, frequency, hematuria, decreased urine volume, vaginal discharge, difficulty urinating, vaginal pain, menstrual problem and pelvic pain.  Musculoskeletal: Negative for arthralgias, back pain, joint swelling, myalgias, neck pain and neck stiffness.  Skin: Negative for rash.  Neurological: Negative for dizziness, syncope, weakness, light-headedness, numbness and headaches.  Psychiatric/Behavioral: Negative for confusion.  10 Systems reviewed and are negative for acute change except as noted in the HPI.     Allergies  Review of patient's allergies indicates no known allergies.  Home Medications   Prior to Admission medications   Medication Sig Start Date End Date Taking? Authorizing Provider  ibuprofen (ADVIL,MOTRIN) 600 MG tablet Take 600 mg by mouth every 4 (four) hours as needed for moderate pain.   Yes Historical Provider, MD  Prenatal Vit-Fe Fumarate-FA (PRENATAL MULTIVITAMIN) TABS tablet Take 1 tablet by mouth daily at 12 noon.   Yes Historical Provider,  MD  ciprofloxacin (CIPRO) 500 MG tablet Take 1 tablet (500 mg total) by mouth 2 (two) times daily. One po bid x 5 days 08/08/13   Donnita Falls Camprubi-Soms, PA-C  metroNIDAZOLE (FLAGYL) 500 MG tablet Take 1 tablet (500 mg total) by mouth 2 (two) times daily. 08/08/13   Michaelyn Wall Strupp Camprubi-Soms, PA-C   BP 139/86  Pulse 50  Temp(Src) 98.4 F (36.9 C) (Oral)  Resp 18  Wt 124 lb (56.246 kg)  SpO2 100%  LMP 08/04/2013  Breastfeeding? No Physical Exam  Nursing note and vitals reviewed. Constitutional: She is oriented to person, place, and time. Vital signs are normal. She appears well-developed and well-nourished.  Non-toxic appearance. No distress.  Afebrile, non-toxic  HENT:  Head: Normocephalic and atraumatic.  Mouth/Throat: Mucous membranes are normal.  MMM  Eyes: Conjunctivae and EOM are normal. Right eye exhibits no discharge. Left eye exhibits no discharge.  Neck: Normal range of motion. Neck supple. No spinous process tenderness and no muscular tenderness present. No rigidity. Normal range of motion present.  Cervical spine with FROM intact, spinous processes and paracervical muscles non-TTP. No meningeal signs  Cardiovascular: Normal rate, regular rhythm, normal heart sounds and intact distal pulses.   No murmur heard. Pulmonary/Chest: Effort normal and breath sounds normal. No respiratory distress. She has no wheezes. She has no rales. She exhibits no tenderness.  Abdominal: Soft. Normal appearance and bowel sounds are normal. She exhibits no distension. There is no tenderness. There is CVA tenderness. There is no rigidity, no rebound, no guarding, no tenderness at McBurney's point and negative Murphy's sign.  Soft, NT/ND, no r/g/r, neg Murphy's, neg McBurney's. Neg Psoas sign. +CVA TTP on R side.  Musculoskeletal: Normal range of motion.       Cervical back: Normal.       Thoracic back: Normal.       Lumbar back: Normal.  No spinous process or paraspinous muscle TTP along  all spinal levels. FROM intact. Strength 5/5 in all extremities.   Neurological: She is alert and oriented to person, place, and time. She has normal strength. No sensory deficit.  Sensation grossly intact in all extremities. Strength 5/5 in all extremities.  Skin: Skin is warm, dry and intact. No rash noted.  Psychiatric: She has a normal mood and affect.    ED Course  Procedures (including critical care time) Labs Review Labs Reviewed  COMPREHENSIVE METABOLIC PANEL - Abnormal; Notable for the following:    Glucose, Bld 110 (*)    All other components within normal limits  URINALYSIS, ROUTINE W REFLEX MICROSCOPIC - Abnormal; Notable for the following:    Hgb urine dipstick MODERATE (*)    All other components within normal limits  CBC WITH DIFFERENTIAL  LIPASE, BLOOD  URINE MICROSCOPIC-ADD ON    Imaging Review Ct Abdomen Pelvis Wo Contrast  08/08/2013   CLINICAL DATA:  Right flank pain for the past 5 days. Status post vaginal delivery on 06/22/2013.  EXAM: CT ABDOMEN AND PELVIS WITHOUT CONTRAST  TECHNIQUE: Multidetector CT imaging of the abdomen and pelvis was performed following the standard protocol without IV contrast.  COMPARISON:  Previous obstetrical ultrasound examinations.  FINDINGS: Normal-appearing kidneys, ureters and urinary bladder. No urinary tract  calculi or hydronephrosis.  2.8 cm left ovarian follicular cyst. Trace amount of free peritoneal fluid, within normal limits of physiological fluid  Unremarkable non contrasted appearance of the liver, spleen, pancreas, gallbladder, adrenal glands, uterus and right ovary. Mild right pericolonic soft tissue stranding, including the adjacent Gerota's fascia. The appendix has a normal appearance. Clear lung bases. Normal appearing bones.  IMPRESSION: 1. Mild right pericolonic soft tissue stranding, possibly due to ascending colitis. 2. No urinary tract calculi or evidence of appendicitis.   Electronically Signed   By: Gordan PaymentSteve  Reid M.D.    On: 08/08/2013 16:03     EKG Interpretation None      MDM   Final diagnoses:  Colitis, acute    Heidi ArntKaneisha D Calzada is a 23 y.o. female presenting with flank pain. +CVA TTP. Abd exam benign, pt afebrile and non-toxic. Denying pain medication or nausea meds. Will get labs, U/A, and Abd/pelvis CT to r/o stones. Start fluids now. Doubt pelvic infection due to lack of symptoms and benign abd exam, will not do pelvic at this time.  5:15 PM Labs unremarkable. U/A demonstrating moderate Hgb but this is likely due to pt being on her menses, no signs of infection. CT demonstrating pericolonic stranding, possibly c/w ascending colitis. Pt has not GI symptoms, afebrile and nontoxic-appearing. Nonacute abdomen. Given this discomfort and CT findings, will tx with Cipro and flagyl x 5 days for colitis. Patient has no comorbidities and is appropriate for outpatient treatment colitis. Spoke at length with patient about  signs or symptoms that should prompt return to emergency Department. Pt voices understanding and is agreeable plan. Pt stable at d/c. Will give f/up with gastroenterology.  BP 139/86  Pulse 50  Temp(Src) 98.4 F (36.9 C) (Oral)  Resp 18  Wt 124 lb (56.246 kg)  SpO2 100%  LMP 08/04/2013  Breastfeeding? No     Donnita FallsMercedes Strupp Kerseyamprubi-Soms, [place] JerseyPA-C 08/08/13 1724

## 2013-08-08 NOTE — ED Notes (Signed)
Patient who had a normal vaginal delivery on May 19th, started having right flank pain five days ago.  Denies nausea, vomiting, fever.  Pain is constant, and no pain with urination and normal amounts of urine.  No strong odor to urine.  Periods are normal, and she is on menses now.  Rates pain as a 10.

## 2013-08-08 NOTE — Discharge Instructions (Signed)
Your labs all looked normal, your urine did not show any infection, and your CT showed that your colon might have a small amount of inflammation around the right side. This could be related to an infection, which could be viral or bacterial. I will be treating you today for a bacterial infection, but understand that this could still be viral and the antibiotics will not change the symptoms. Continue using tylenol and ibuprofen for pain. If you develop any changing or worsening of symptoms, return to the emergency department. Follow up with the gastroenterologist in 1 week for further evaluation and treatment. See your primary care doctor this week for a check up of your symptoms. Take all antibiotics until finished, do not drink alcohol while taking these.   Colitis Colitis is inflammation of the colon. Colitis can be a short-term or long-standing (chronic) illness. Crohn's disease and ulcerative colitis are 2 types of colitis which are chronic. They usually require lifelong treatment. CAUSES  There are many different causes of colitis, including:  Viruses.  Germs (bacteria).  Medicine reactions. SYMPTOMS   Diarrhea.  Intestinal bleeding.  Pain.  Fever.  Throwing up (vomiting).  Tiredness (fatigue).  Weight loss.  Bowel blockage. DIAGNOSIS  The diagnosis of colitis is based on examination and stool or blood tests. X-rays, CT scan, and colonoscopy may also be needed. TREATMENT  Treatment may include:  Fluids given through the vein (intravenously).  Bowel rest (nothing to eat or drink for a period of time).  Medicine for pain and diarrhea.  Medicines (antibiotics) that kill germs.  Cortisone medicines.  Surgery. HOME CARE INSTRUCTIONS   Get plenty of rest.  Drink enough water and fluids to keep your urine clear or pale yellow.  Eat a well-balanced diet.  Call your caregiver for follow-up as recommended. SEEK IMMEDIATE MEDICAL CARE IF:   You develop  chills.  You have an oral temperature above 102 F (38.9 C), not controlled by medicine.  You have extreme weakness, fainting, or dehydration.  You have repeated vomiting.  You develop severe belly (abdominal) pain or are passing bloody or tarry stools. MAKE SURE YOU:   Understand these instructions.  Will watch your condition.  Will get help right away if you are not doing well or get worse. Document Released: 02/29/2004 Document Revised: 04/15/2011 Document Reviewed: 05/26/2009 [facility] Patient Information 2015 [place]. This information is not intended to replace advice given to you by your health care provider. Make sure you discuss any questions you have with your health care provider.

## 2013-08-09 NOTE — ED Provider Notes (Signed)
Medical screening examination/treatment/procedure(s) were conducted as a shared visit with non-physician practitioner(s) and myself.  I personally evaluated the patient during the encounter.   EKG Interpretation None      I interviewed and examined the patient. Lungs are CTAB. Cardiac exam wnl. Abdomen soft. No focal ttp on my exam. CT showing colitis. Will rec sx treatment and f/u w/ pcp.   Junius ArgyleForrest S Akasha Melena, MD 08/09/13 2106

## 2013-11-30 ENCOUNTER — Encounter (HOSPITAL_COMMUNITY): Payer: Self-pay | Admitting: Emergency Medicine

## 2013-11-30 ENCOUNTER — Emergency Department (HOSPITAL_COMMUNITY): Payer: Medicaid Other

## 2013-11-30 ENCOUNTER — Emergency Department (HOSPITAL_COMMUNITY)
Admission: EM | Admit: 2013-11-30 | Discharge: 2013-12-01 | Disposition: A | Discharge status: Home / Self Care | Payer: Medicaid Other | Attending: Emergency Medicine | Admitting: Emergency Medicine

## 2013-11-30 DIAGNOSIS — Z87898 Personal history of other specified conditions: Secondary | ICD-10-CM | POA: Insufficient documentation

## 2013-11-30 DIAGNOSIS — Z862 Personal history of diseases of the blood and blood-forming organs and certain disorders involving the immune mechanism: Secondary | ICD-10-CM | POA: Diagnosis not present

## 2013-11-30 DIAGNOSIS — N760 Acute vaginitis: Secondary | ICD-10-CM | POA: Insufficient documentation

## 2013-11-30 DIAGNOSIS — Z3202 Encounter for pregnancy test, result negative: Secondary | ICD-10-CM | POA: Diagnosis not present

## 2013-11-30 DIAGNOSIS — R1011 Right upper quadrant pain: Secondary | ICD-10-CM | POA: Diagnosis present

## 2013-11-30 DIAGNOSIS — B9689 Other specified bacterial agents as the cause of diseases classified elsewhere: Secondary | ICD-10-CM

## 2013-11-30 LAB — CBC WITH DIFFERENTIAL/PLATELET
Basophils Absolute: 0 10*3/uL (ref 0.0–0.1)
Basophils Relative: 0 % (ref 0–1)
EOS ABS: 0.1 10*3/uL (ref 0.0–0.7)
EOS PCT: 1 % (ref 0–5)
HCT: 33.2 % — ABNORMAL LOW (ref 36.0–46.0)
Hemoglobin: 11.6 g/dL — ABNORMAL LOW (ref 12.0–15.0)
LYMPHS ABS: 1.8 10*3/uL (ref 0.7–4.0)
Lymphocytes Relative: 26 % (ref 12–46)
MCH: 29.7 pg (ref 26.0–34.0)
MCHC: 34.9 g/dL (ref 30.0–36.0)
MCV: 84.9 fL (ref 78.0–100.0)
MONO ABS: 0.4 10*3/uL (ref 0.1–1.0)
Monocytes Relative: 6 % (ref 3–12)
Neutro Abs: 4.6 10*3/uL (ref 1.7–7.7)
Neutrophils Relative %: 67 % (ref 43–77)
Platelets: 246 10*3/uL (ref 150–400)
RBC: 3.91 MIL/uL (ref 3.87–5.11)
RDW: 14.3 % (ref 11.5–15.5)
WBC: 6.9 10*3/uL (ref 4.0–10.5)

## 2013-11-30 LAB — COMPREHENSIVE METABOLIC PANEL
ALT: 6 U/L (ref 0–35)
ANION GAP: 15 (ref 5–15)
AST: 11 U/L (ref 0–37)
Albumin: 3.6 g/dL (ref 3.5–5.2)
Alkaline Phosphatase: 73 U/L (ref 39–117)
BUN: 7 mg/dL (ref 6–23)
CALCIUM: 8.9 mg/dL (ref 8.4–10.5)
CHLORIDE: 100 meq/L (ref 96–112)
CO2: 24 meq/L (ref 19–32)
CREATININE: 0.73 mg/dL (ref 0.50–1.10)
GFR calc Af Amer: >90 mL/min (ref >90)
Glucose, Bld: 88 mg/dL (ref 70–99)
Potassium: 3.9 mEq/L (ref 3.7–5.3)
Sodium: 139 mEq/L (ref 137–147)
Total Bilirubin: <0.2 mg/dL — ABNORMAL LOW (ref 0.3–1.2)
Total Protein: 7.1 g/dL (ref 6.0–8.3)

## 2013-11-30 LAB — URINALYSIS, ROUTINE W REFLEX MICROSCOPIC
BILIRUBIN URINE: NEGATIVE
Glucose, UA: NEGATIVE mg/dL
Hgb urine dipstick: NEGATIVE
Ketones, ur: NEGATIVE mg/dL
Leukocytes, UA: NEGATIVE
Nitrite: NEGATIVE
PH: 7 (ref 5.0–8.0)
Protein, ur: NEGATIVE mg/dL
Specific Gravity, Urine: 1.015 (ref 1.005–1.030)
UROBILINOGEN UA: 0.2 mg/dL (ref 0.0–1.0)

## 2013-11-30 LAB — POC URINE PREG, ED: Preg Test, Ur: NEGATIVE

## 2013-11-30 LAB — LIPASE, BLOOD: LIPASE: 22 U/L (ref 11–59)

## 2013-11-30 NOTE — ED Notes (Signed)
Pt reports having child 5 months ago. States for last 4 months she has had RLQ abdominal cramping that hurts with movement. States "it feels like something on the inside hurts every time I move or cough." Pt denies any N/V/D. Reports normal BM. Pt in NAD.

## 2013-11-30 NOTE — ED Notes (Signed)
Patient returned from ultrasound.

## 2013-11-30 NOTE — ED Provider Notes (Signed)
CSN: [id number]     Arrival date & time 11/30/13  1734 History   First Heidi Hinton Initiated Contact with Patient 11/30/13 2056     Chief Complaint  Patient presents with  . Abdominal Pain    Patient is a 23 y.o. female presenting with abdominal pain. The history is provided by the patient.  Abdominal Pain Pain location:  R flank and RUQ Pain quality: aching   Pain radiates to:  Does not radiate Pain severity:  Severe Onset quality:  Gradual Duration:  20 weeks Timing:  Intermittent Progression:  Waxing and waning Chronicity:  Recurrent Context comment:  Movement, picking up her children Relieved by:  NSAIDs Worsened by:  Movement and position changes Ineffective treatments:  None tried Associated symptoms: no anorexia, no chest pain, no constipation, no diarrhea, no dysuria, no fever, no nausea, no shortness of breath, no vaginal bleeding, no vaginal discharge and no vomiting   Risk factors: not obese and not pregnant     Past Medical History  Diagnosis Date  . Anemia   . Rh negative status during pregnancy 04/2011  . LSIL (low grade squamous intraepithelial lesion) on Pap smear 03/11/2011  . Late prenatal care    Past Surgical History  Procedure Laterality Date  . Wisdom tooth extraction  2009   Family History  Problem Relation Age of Onset  . Alcohol abuse Mother   . Asthma Brother   . Cancer Brother     osteosarcoma  . Cancer Maternal Grandmother   . Mental illness Maternal Grandmother   . Alzheimer's disease Maternal Grandmother   . Anesthesia problems Neg Hx   . Hypotension Neg Hx   . Malignant hyperthermia Neg Hx   . Pseudochol deficiency Neg Hx    History  Substance Use Topics  . Smoking status: Never Smoker   . Smokeless tobacco: Never Used  . Alcohol Use: No   OB History   Grav Para Term Preterm Abortions TAB SAB Ect Mult Living   2 2 2  0 0 0 0 0 0 2     Review of Systems  Constitutional: Negative for fever.  Respiratory: Negative for shortness of  breath.   Cardiovascular: Negative for chest pain.  Gastrointestinal: Positive for abdominal pain. Negative for nausea, vomiting, diarrhea, constipation and anorexia.  Genitourinary: Positive for flank pain. Negative for dysuria, frequency, vaginal bleeding, vaginal discharge and pelvic pain.  Musculoskeletal: Negative for gait problem.  Skin: Negative for rash.  All other systems reviewed and are negative.     Allergies  Review of patient's allergies indicates no known allergies.  Home Medications   Prior to Admission medications   Medication Sig Start Date End Date Taking? Authorizing Provider  acetaminophen (TYLENOL) 500 MG tablet Take 1,000 mg by mouth every 6 (six) hours as needed.   Yes Historical Provider, Heidi Hinton  ibuprofen (ADVIL,MOTRIN) 600 MG tablet Take 600 mg by mouth every 4 (four) hours as needed for moderate pain.   Yes Historical Provider, Heidi Hinton   BP 134/81  Pulse 83  Temp(Src) 98.6 F (37 C) (Oral)  Resp 18  SpO2 100%  LMP 11/16/2013 Physical Exam  Constitutional: She is oriented to person, place, and time. She appears well-developed and well-nourished. She is cooperative. No distress.  HENT:  Head: Normocephalic and atraumatic.  Right Ear: External ear normal.  Left Ear: External ear normal.  Neck: Normal range of motion and phonation normal.  Cardiovascular: Normal rate and regular rhythm.   Pulmonary/Chest: Effort normal and breath sounds  normal. No respiratory distress. She has no wheezes. She has no rales.  Abdominal: Soft. She exhibits no distension. There is tenderness in the right upper quadrant and right lower quadrant. There is positive Murphy's sign. There is no rebound and no guarding.  Genitourinary: Uterus normal. Uterus is not enlarged and not tender. Cervix exhibits no motion tenderness. Right adnexum displays no mass, no tenderness and no fullness. Left adnexum displays no mass, no tenderness and no fullness.  Chaperone present; copious thin yellow  discharge  Neurological: She is alert and oriented to person, place, and time.  Skin: Skin is warm and dry. No rash noted. She is not diaphoretic.  Psychiatric: She has a normal mood and affect. Her speech is normal.    ED Course  Procedures  Labs Review  Results for orders placed during the hospital encounter of 11/30/13  WET PREP, GENITAL      Result Value Ref Range   Yeast Wet Prep HPF POC NONE SEEN  NONE SEEN   Trich, Wet Prep NONE SEEN  NONE SEEN   Clue Cells Wet Prep HPF POC MODERATE (*) NONE SEEN   WBC, Wet Prep HPF POC MODERATE (*) NONE SEEN  URINALYSIS, ROUTINE W REFLEX MICROSCOPIC      Result Value Ref Range   Color, Urine YELLOW  YELLOW   APPearance CLEAR  CLEAR   Specific Gravity, Urine 1.015  1.005 - 1.030   pH 7.0  5.0 - 8.0   Glucose, UA NEGATIVE  NEGATIVE mg/dL   Hgb urine dipstick NEGATIVE  NEGATIVE   Bilirubin Urine NEGATIVE  NEGATIVE   Ketones, ur NEGATIVE  NEGATIVE mg/dL   Protein, ur NEGATIVE  NEGATIVE mg/dL   Urobilinogen, UA 0.2  0.0 - 1.0 mg/dL   Nitrite NEGATIVE  NEGATIVE   Leukocytes, UA NEGATIVE  NEGATIVE  CBC WITH DIFFERENTIAL      Result Value Ref Range   WBC 6.9  4.0 - 10.5 K/uL   RBC 3.91  3.87 - 5.11 MIL/uL   Hemoglobin 11.6 (*) 12.0 - 15.0 g/dL   HCT 40.9 (*) 81.1 - 91.4 %   MCV 84.9  78.0 - 100.0 fL   MCH 29.7  26.0 - 34.0 pg   MCHC 34.9  30.0 - 36.0 g/dL   RDW 78.2  95.6 - 21.3 %   Platelets 246  150 - 400 K/uL   Neutrophils Relative % 67  43 - 77 %   Lymphocytes Relative 26  12 - 46 %   Monocytes Relative 6  3 - 12 %   Eosinophils Relative 1  0 - 5 %   Basophils Relative 0  0 - 1 %   Neutro Abs 4.6  1.7 - 7.7 K/uL   Lymphs Abs 1.8  0.7 - 4.0 K/uL   Monocytes Absolute 0.4  0.1 - 1.0 K/uL   Eosinophils Absolute 0.1  0.0 - 0.7 K/uL   Basophils Absolute 0.0  0.0 - 0.1 K/uL   RBC Morphology POLYCHROMASIA PRESENT     WBC Morphology ATYPICAL LYMPHOCYTES    COMPREHENSIVE METABOLIC PANEL      Result Value Ref Range   Sodium 139  137  - 147 mEq/L   Potassium 3.9  3.7 - 5.3 mEq/L   Chloride 100  96 - 112 mEq/L   CO2 24  19 - 32 mEq/L   Glucose, Bld 88  70 - 99 mg/dL   BUN 7  6 - 23 mg/dL   Creatinine, Ser 0.86  0.50 -  1.10 mg/dL   Calcium 8.9  8.4 - 16.110.5 mg/dL   Total Protein 7.1  6.0 - 8.3 g/dL   Albumin 3.6  3.5 - 5.2 g/dL   AST 11  0 - 37 U/L   ALT 6  0 - 35 U/L   Alkaline Phosphatase 73  39 - 117 U/L   Total Bilirubin <0.2 (*) 0.3 - 1.2 mg/dL   GFR calc non Af Amer >90  >90 mL/min   GFR calc Af Amer >90  >90 mL/min   Anion gap 15  5 - 15  LIPASE, BLOOD      Result Value Ref Range   Lipase 22  11 - 59 U/L  POC URINE PREG, ED      Result Value Ref Range   Preg Test, Ur NEGATIVE  NEGATIVE   Imaging Review [place] Abdomen Limited Ruq  11/30/2013   CLINICAL DATA:  Right upper quadrant abdominal pain.  EXAM: US ABDOMEN LIMITED - RIGHT UPPER QUADRANT  COMPARISON:  Abdominal CT 08/08/2013  FINDINGS: Gallbladder:  No gallstones or wall thickening visualized. No sonographic Murphy sign noted.  Common bile duct:  Diameter: 4 mm  Liver:  No focal lesion identified. Within normal limits in parenchymal echogenicity.  IMPRESSION: Negative right upper quadrant ultrasound.   Electronically Signed   By: Tiburcio PeaJonathan  Watts M.D.   On: 11/30/2013 23:22    MDM   Final diagnoses:  RUQ abdominal pain  Bacterial vaginosis   23 y.o. otherwise healthy female presents to the ED due to chronic right flank pain. She states this is an ongoing for the last 5 months. Has been intermittent, waxing and waning, relieved with ibuprofen. Most recent episode began yesterday. She states that she is out of ibuprofen. Denies fever, chills, chest pain, shortness of breath, dysuria, vaginal bleeding, vaginal discharge.  Exam as above. She is well-appearing and in no acute distress. Concern for symptomatic cholelithiasis. UA negative, no concern for kidney stone, pyelonephritis, UTI. UPT negative-no concern for ectopic pregnancy. CBC shows mild anemia,  improved from baseline. CMP unremarkable. Lipase within normal limits.  Wet prep showed moderate clue cells. Will treat with Flagyl for bacterial vaginosis.  Right upper quadrant ultrasound showed no acute findings, no gallstones  No concern for PID, ovarian torsion, cholecystitis, pancreatitis, small bowel obstruction.  Strict return precautions discussed with the patient and given writing. She was discharged with prescriptions for Flagyl and ibuprofen. She was given a resource guide to establish with a primary care physician. She was discharged home.  This case was managed in conjunction with my attending Dr. Lynelle DoctorKnapp.    Heidi GlennAnn Odesser Tourangeau, Heidi Hinton 12/01/13 [id number]

## 2013-12-01 LAB — WET PREP, GENITAL
Trich, Wet Prep: NONE SEEN
Yeast Wet Prep HPF POC: NONE SEEN

## 2013-12-01 MED ORDER — IBUPROFEN 400 MG PO TABS
600.0000 mg | ORAL_TABLET | Freq: Once | ORAL | Status: AC
  Administered 2013-12-01: 600 mg via ORAL
  Filled 2013-12-01 (×2): qty 1

## 2013-12-01 MED ORDER — METRONIDAZOLE 500 MG PO TABS: 500.0000 mg | ORAL_TABLET | Freq: Two times a day (BID) | ORAL | Status: DC

## 2013-12-01 MED ORDER — IBUPROFEN 600 MG PO TABS: 600.0000 mg | ORAL_TABLET | Freq: Three times a day (TID) | ORAL | Status: DC | PRN

## 2013-12-01 MED ORDER — MORPHINE SULFATE 4 MG/ML IJ SOLN
4.0000 mg | Freq: Once | INTRAMUSCULAR | Status: AC
  Administered 2013-12-01: 4 mg via INTRAVENOUS
  Filled 2013-12-01: qty 1

## 2013-12-01 NOTE — ED Provider Notes (Signed)
I saw and evaluated the patient, reviewed the resident's note and I agree with the findings and plan.  Pt has ttp RUQ and right flank.  Labs and us unremarkable.  No obvious source for her pain.  At this time there does not appear to be any evidence of an acute emergency medical condition and the patient appears stable for discharge with appropriate outpatient follow up.   Linwood DibblesJon Audryanna Zurita, MD 12/01/13 1539

## 2013-12-01 NOTE — Discharge Instructions (Signed)
Abdominal Pain, Women °Abdominal (stomach, pelvic, or belly) pain can be caused by many things. It is important to tell your doctor: °· The location of the pain. °· Does it come and go or is it present all the time? °· Are there things that start the pain (eating certain foods, exercise)? °· Are there other symptoms associated with the pain (fever, nausea, vomiting, diarrhea)? °All of this is helpful to know when trying to find the cause of the pain. °CAUSES  °· Stomach: virus or bacteria infection, or ulcer. °· Intestine: appendicitis (inflamed appendix), regional ileitis (Crohn's disease), ulcerative colitis (inflamed colon), irritable bowel syndrome, diverticulitis (inflamed diverticulum of the colon), or cancer of the stomach or intestine. °· Gallbladder disease or stones in the gallbladder. °· Kidney disease, kidney stones, or infection. °· Pancreas infection or cancer. °· Fibromyalgia (pain disorder). °· Diseases of the female organs: °¨ Uterus: fibroid (non-cancerous) tumors or infection. °¨ Fallopian tubes: infection or tubal pregnancy. °¨ Ovary: cysts or tumors. °¨ Pelvic adhesions (scar tissue). °¨ Endometriosis (uterus lining tissue growing in the pelvis and on the pelvic organs). °¨ Pelvic congestion syndrome (female organs filling up with blood just before the menstrual period). °¨ Pain with the menstrual period. °¨ Pain with ovulation (producing an egg). °¨ Pain with an IUD (intrauterine device, birth control) in the uterus. °¨ Cancer of the female organs. °· Functional pain (pain not caused by a disease, may improve without treatment). °· Psychological pain. °· Depression. °DIAGNOSIS  °Your doctor will decide the seriousness of your pain by doing an examination. °· Blood tests. °· X-rays. °· Ultrasound. °· CT scan (computed tomography, special type of X-ray). °· MRI (magnetic resonance imaging). °· Cultures, for infection. °· Barium enema (dye inserted in the large intestine, to better view it with  X-rays). °· Colonoscopy (looking in intestine with a lighted tube). °· Laparoscopy (minor surgery, looking in abdomen with a lighted tube). °· Major abdominal exploratory surgery (looking in abdomen with a large incision). °TREATMENT  °The treatment will depend on the cause of the pain.  °· Many cases can be observed and treated at home. °· Over-the-counter medicines recommended by your caregiver. °· Prescription medicine. °· Antibiotics, for infection. °· Birth control pills, for painful periods or for ovulation pain. °· Hormone treatment, for endometriosis. °· Nerve blocking injections. °· Physical therapy. °· Antidepressants. °· Counseling with a psychologist or psychiatrist. °· Minor or major surgery. °HOME CARE INSTRUCTIONS  °· Do not take laxatives, unless directed by your caregiver. °· Take over-the-counter pain medicine only if ordered by your caregiver. Do not take aspirin because it can cause an upset stomach or bleeding. °· Try a clear liquid diet (broth or water) as ordered by your caregiver. Slowly move to a bland diet, as tolerated, if the pain is related to the stomach or intestine. °· Have a thermometer and take your temperature several times a day, and record it. °· Bed rest and sleep, if it helps the pain. °· Avoid sexual intercourse, if it causes pain. °· Avoid stressful situations. °· Keep your follow-up appointments and tests, as your caregiver orders. °· If the pain does not go away with medicine or surgery, you may try: °¨ Acupuncture. °¨ Relaxation exercises (yoga, meditation). °¨ Group therapy. °¨ Counseling. °SEEK MEDICAL CARE IF:  °· You notice certain foods cause stomach pain. °· Your home care treatment is not helping your pain. °· You need stronger pain medicine. °· You want your IUD removed. °· You feel faint or   lightheaded.  You develop nausea and vomiting.  You develop a rash.  You are having side effects or an allergy to your medicine. SEEK IMMEDIATE MEDICAL CARE IF:   Your  pain does not go away or gets worse.  You have a fever.  Your pain is felt only in portions of the abdomen. The right side could possibly be appendicitis. The left lower portion of the abdomen could be colitis or diverticulitis.  You are passing blood in your stools (bright red or black tarry stools, with or without vomiting).  You have blood in your urine.  You develop chills, with or without a fever.  You pass out. MAKE SURE YOU:   Understand these instructions.  Will watch your condition.  Will get help right away if you are not doing well or get worse. Document Released: 11/18/2006 Document Revised: 06/07/2013 Document Reviewed: 12/08/2008 [facility] Patient Information 2015 [place], [place]. This information is not intended to replace advice given to you by your health care provider. Make sure you discuss any questions you have with your health care provider.  Bacterial Vaginosis Bacterial vaginosis is a vaginal infection that occurs when the normal balance of bacteria in the vagina is disrupted. It results from an overgrowth of certain bacteria. This is the most common vaginal infection in women of childbearing age. Treatment is important to prevent complications, especially in pregnant women, as it can cause a premature delivery. CAUSES  Bacterial vaginosis is caused by an increase in harmful bacteria that are normally present in smaller amounts in the vagina. Several different kinds of bacteria can cause bacterial vaginosis. However, the reason that the condition develops is not fully understood. RISK FACTORS Certain activities or behaviors can put you at an increased risk of developing bacterial vaginosis, including:  Having a new sex partner or multiple sex partners.  Douching.  Using an intrauterine device (IUD) for contraception. Women do not get bacterial vaginosis from toilet seats, bedding, swimming pools, or contact with objects around them. SIGNS AND SYMPTOMS  Some  women with bacterial vaginosis have no signs or symptoms. Common symptoms include:  Grey vaginal discharge.  A fishlike odor with discharge, especially after sexual intercourse.  Itching or burning of the vagina and vulva.  Burning or pain with urination. DIAGNOSIS  Your health care provider will take a medical history and examine the vagina for signs of bacterial vaginosis. A sample of vaginal fluid may be taken. Your health care provider will look at this sample under a microscope to check for bacteria and abnormal cells. A vaginal pH test may also be done.  TREATMENT  Bacterial vaginosis may be treated with antibiotic medicines. These may be given in the form of a pill or a vaginal cream. A second round of antibiotics may be prescribed if the condition comes back after treatment.  HOME CARE INSTRUCTIONS   Only take over-the-counter or prescription medicines as directed by your health care provider.  If antibiotic medicine was prescribed, take it as directed. Make sure you finish it even if you start to feel better.  Do not have sex until treatment is completed.  Tell all sexual partners that you have a vaginal infection. They should see their health care provider and be treated if they have problems, such as a mild rash or itching.  Practice safe sex by using condoms and only having one sex partner. SEEK MEDICAL CARE IF:   Your symptoms are not improving after 3 days of treatment.  You have increased  discharge or pain.  You have a fever. MAKE SURE YOU:   Understand these instructions.  Will watch your condition.  Will get help right away if you are not doing well or get worse. FOR MORE INFORMATION  Centers for Disease Control and Prevention, Division of STD Prevention: SolutionApps.co.zawww.cdc.gov/std American Sexual Health Association (ASHA): www.ashastd.org  Document Released: 01/21/2005 Document Revised: 11/11/2012 Document Reviewed: 09/02/2012 [facility] Patient Information 2015  [place]. This information is not intended to replace advice given to you by your health care provider. Make sure you discuss any questions you have with your health care provider.   Emergency Department Resource Guide 1) Find a Doctor and Pay Out of Pocket Although you won't have to find out who is covered by your insurance plan, it is a good idea to ask around and get recommendations. You will then need to call the office and see if the doctor you have chosen will accept you as a new patient and what types of options they offer for patients who are self-pay. Some doctors offer discounts or will set up payment plans for their patients who do not have insurance, but you will need to ask so you aren't surprised when you get to your appointment.  2) Contact Your Local Health Department Not all health departments have doctors that can see patients for sick visits, but many do, so it is worth a call to see if yours does. If you don't know where your local health department is, you can check in your phone book. The CDC also has a tool to help you locate your state's health department, and many state websites also have listings of all of their local health departments.  3) Find a Walk-in Clinic If your illness is not likely to be very severe or complicated, you may want to try a walk in clinic. These are popping up all over the country in pharmacies, drugstores, and shopping centers. They're usually staffed by nurse practitioners or physician assistants that have been trained to treat common illnesses and complaints. They're usually fairly quick and inexpensive. However, if you have serious medical issues or chronic medical problems, these are probably not your best option.  No Primary Care Doctor: - Call Health Connect at  [phone] - they can help you locate a primary care doctor that  accepts your insurance, provides certain services, etc. - Physician Referral Service- [phone]  Chronic Pain  Problems: Organization         Address  Phone   Notes  [facility]  [phone] Patients need to be referred by their primary care doctor.   Medication Assistance: Organization         Address  Phone   Notes  [facility] [address] [place], [place] [address] [phone] --Must be a resident of [facility] [place] -- Must have NO insurance coverage whatsoever (no Medicaid/ Medicare, etc.) -- The pt. MUST have a primary care doctor that directs their care regularly and follows them in the community   MedAssist  [phone]) [phone]   [employer]  [phone]) [phone]    Agencies that provide inexpensive medical care: Organization         Address  Phone   Notes  [facility]  [phone]   [facility]    [phone]   [facility] [address] [place], [place] [address] [phone]   [facility]  [place] [address], [place] [phone]   Planned Parenthood    [phone]   [facility]    [phone]   Community Health and [facility]  [address], [place] Phone:  [phone], Fax:  [phone] Hours of Operation:  9 am - 6 pm, M-F.  Also accepts Medicaid/Medicare and self-pay.  [facility]  [address], [place] Phone: [phone], Fax: [phone]. Hours of Operation:  8:30 am - 5:30 pm, M-F.  Also accepts Medicaid and self-pay.  [facility] [place] [address] [place] Phone: [phone]   [facility] [address] Natasha Bence [place], [place] [phone], Ext. [phone] Mondays & Thursdays: 7-9 AM.  First 15 patients are seen on a first come, first serve basis.    Medicaid-accepting [facility] Providers:  Organization         Address  Phone   Notes  [facility] [address], [place] [phone] Also  accepts self-pay patients.  [facility] [address] Laurell Josephs [place], [place]  [phone]   [facility] [address], [place] [phone]   [facility] [address] [place] [phone]   Renaye Rakers [address], [place]   [phone] Only accepts [place] Access [place] patients after they have their name applied to their card.   Self-Pay (no insurance) in [facility]:  Organization         Address  Phone   Notes  Sickle Cell Patients, [facility] [address] [place], [place] [phone]   [facility] [address] [place], [place] [phone]   [facility]  [address], [place] [phone]   [facility]/Dr. Osei-Bonsu  [address] [place] or [address], [place] [phone] Phone number for both [place] and [place] locations is the same.  Urgent Medical and [facility] [address], [place] [phone]   [facility] [address], [place] or [address] [phone]   [facility] [address], [place] [phone], phone; [phone], fax Sees patients 1st and 3rd Saturday of every month.  Must not qualify for public or private insurance (i.e. Medicaid, Medicare, [place] Health Choice, Veterans' Benefits)  Household income should be no more than 200% of the poverty level The clinic cannot treat you if you are pregnant or think you are pregnant  Sexually transmitted diseases are not treated at the clinic.    Dental Care: Organization         Address  Phone  Notes  [facility] Department of [facility] [address] [place], [place] [phone] Accepts children up to age 67 who are enrolled in [place] or [place] Health Choice; pregnant  women with a Medicaid card; and children who have applied for Medicaid or [place] Health Choice, but were declined, whose parents can pay a reduced fee at time of service.  [facility]  [address], [place] [phone] Accepts children up to age 32 who are enrolled in [place] or [place] Health Choice; pregnant women with a Medicaid card; and children who have  applied for Medicaid or [place] Health Choice, but were declined, whose parents can pay a reduced fee at time of service.  [facility]  [address], [place] [phone] Patients are seen by appointment only. Walk-ins are not accepted. [facility] will see patients 24 years of age and older. Monday - Tuesday (8am-5pm) Most Wednesdays (8:30-5pm) $30 per visit, cash only  [facility]  [address], [facility] [phone] Patients are seen by appointment only. Walk-ins are not accepted. [facility] will see patients 24 years of age and older. One Wednesday Evening (Monthly: Volunteer Based).  $30 per visit, cash only  [employer] [facility]  [phone] for adults; Children under age 674, call Graduate Pediatric Dentistry at [phone]. Children aged 24-14, please call [phone] to request a pediatric application.  Dental services are provided in all areas of dental care including fillings, crowns and bridges, complete and partial dentures, implants, gum treatment, root canals, and extractions. Preventive care is also provided. Treatment is provided to both adults and children. Patients are selected via a lottery and there is often a waiting list.   [facility] [address], [place]  [phone] [email or web address]   [facility] [address], [place], [place] [phone], Ext. [phone] Second and Fourth Thursday of each month, opens at 6:30 AM; Clinic ends at 9 AM.  Patients are  seen on a first-come first-served basis, and a limited number are seen during each clinic.   [facility]  [address], [place], [place] [phone]   Eligibility Requirements You must have lived in [place], [place], or [place] counties for at least the last three months.   You cannot be eligible for state or federal sponsored National Cityhealthcare insurance, including CIGNAVeterans Administration, [place]icaid, or [employer] EntertainmentMedicare.   You generally cannot be eligible for healthcare insurance through your employer.    How to apply: Eligibility screenings are held every Tuesday and Wednesday afternoon from 1:00 pm until 4:00 pm. You do not need an appointment for the interview!  [facility] [address], [place], [place] [phone]   [facility]  [phone]   [facility]  [phone]   [facility]  [phone]    Behavioral Health Resources in the Community: Intensive Outpatient Programs Organization         Address  Phone  Notes  [facility] [address], [place], [place] [phone]   [facility] [address], [place], [place] [phone]   [facility] [address], [place], [place]  [phone]   [facility] [address],  [place], [place] [phone] or [phone]   Substance Abuse Resources Organization         Address  Phone  Notes  [facility] Drug Services  [phone]   [facility]  [phone]   [facility]  [phone]   [facility]  [phone]   Residential & Outpatient Substance Abuse Program  [phone]   Psychological Services Organization         Address  Phone  Notes  [facility]  [phone]- [phone]   [facility]  [phone]- [phone]   [facility] [address], [place] [phone] or [phone]    Mobile Crisis  Teams Organization  Address  Phone  Notes  [facility], Mobile Crisis Care Unit  [phone]   [facility]  [address] [place], [place]   [facility] [address], [place] [phone]    Self-Help/Support Groups Organization         Address  Phone             Notes  [place] of [place] - variety of support groups  [place] Call for more information  Narcotics Anonymous (NA), [facility] [address], [employer] [place]  2 meetings at this location   Special educational needs teacher         Address  Phone  Notes  ASAP Residential Treatment [place],    [place]  [phone]   [facility]  [address] [place] [phone], [place], [place]   [place], [place] [phone] Admissions: 8am-3pm M-F  Incentives Substance [place] [address],    [place], [place] [phone]   [facility] [address] [place], [place], [place]   [facility] [address],  [place], [place]   [facility] - Intensive Outpatient [address] Kristeen Mans [address], [place], [place]   [facility] [place]  [place], [place] [phone] or [phone]   [facility] (RTS) [address], [place], [place] Accepts Medicaid  [facility] [address],  [place] [phone] Substance Abuse/Addiction Treatment   [facility] Organization         Address  Phone  Notes  [facility]  [phone]   Domenic Schwab, PhD [address] Arlis Porta [place], [place]   [phone] or [phone]   [facility], [place] [phone]   [facility] [address] [place], [place] [phone]  Insurance/Medicaid/sponsorship through [facility] and Families [address]                                    [place], [place] [phone] [place] [address], [place] [phone]    Dr. Adele Schilder  [phone]   [facility]. 1) [address] [place] 2) [place] 3)  [place] [address], [place] [phone]  [phone]   [place] [phone] or [phone] (After Hours)

## 2013-12-02 LAB — GC/CHLAMYDIA PROBE AMP
CT Probe RNA: POSITIVE — AB
GC Probe RNA: NEGATIVE

## 2013-12-03 ENCOUNTER — Telehealth (HOSPITAL_COMMUNITY): Payer: Self-pay

## 2013-12-03 NOTE — ED Notes (Signed)
Positive for Chlamydia.  Chart sent to EDP office for review 

## 2013-12-04 ENCOUNTER — Telehealth (HOSPITAL_BASED_OUTPATIENT_CLINIC_OR_DEPARTMENT_OTHER): Payer: Self-pay | Admitting: Emergency Medicine

## 2013-12-04 NOTE — Telephone Encounter (Signed)
Post ED Visit - Positive Culture Follow-up: Successful Patient Follow-Up   Positive Chlamydia culture  [x]  Patient discharged without antimicrobial prescription and treatment is now indicated []  Organism is resistant to prescribed ED discharge antimicrobial []  Patient with positive blood cultures  Changes discussed with ED provider: Dr Doug SouSam Jacubowitz New antibiotic prescription Zithromax 1, 000 mg PO x once [facility] [phone] voicemail [facility] faxed  Contacted patient, date 12/04/13, time 1133-   ID verified, patient notified of positive Chlamydia and need for treatment. STD instructions provided, patient verbalized understanding.    [name], Heidi Hinton 12/04/2013, 11:38 AM

## 2013-12-06 ENCOUNTER — Encounter (HOSPITAL_COMMUNITY): Payer: Self-pay | Admitting: Emergency Medicine

## 2014-03-13 ENCOUNTER — Encounter (HOSPITAL_BASED_OUTPATIENT_CLINIC_OR_DEPARTMENT_OTHER): Payer: Self-pay | Admitting: *Deleted

## 2014-03-13 ENCOUNTER — Emergency Department (HOSPITAL_BASED_OUTPATIENT_CLINIC_OR_DEPARTMENT_OTHER): Payer: Medicaid Other

## 2014-03-13 ENCOUNTER — Emergency Department (HOSPITAL_BASED_OUTPATIENT_CLINIC_OR_DEPARTMENT_OTHER)
Admission: EM | Admit: 2014-03-13 | Discharge: 2014-03-13 | Disposition: A | Discharge status: Home / Self Care | Payer: Medicaid Other | Attending: Emergency Medicine | Admitting: Emergency Medicine

## 2014-03-13 DIAGNOSIS — Z3202 Encounter for pregnancy test, result negative: Secondary | ICD-10-CM | POA: Diagnosis not present

## 2014-03-13 DIAGNOSIS — M1990 Unspecified osteoarthritis, unspecified site: Secondary | ICD-10-CM | POA: Insufficient documentation

## 2014-03-13 DIAGNOSIS — Z79899 Other long term (current) drug therapy: Secondary | ICD-10-CM | POA: Insufficient documentation

## 2014-03-13 DIAGNOSIS — Z862 Personal history of diseases of the blood and blood-forming organs and certain disorders involving the immune mechanism: Secondary | ICD-10-CM | POA: Insufficient documentation

## 2014-03-13 DIAGNOSIS — M546 Pain in thoracic spine: Secondary | ICD-10-CM | POA: Insufficient documentation

## 2014-03-13 DIAGNOSIS — M549 Dorsalgia, unspecified: Secondary | ICD-10-CM | POA: Diagnosis not present

## 2014-03-13 DIAGNOSIS — M25552 Pain in left hip: Secondary | ICD-10-CM | POA: Diagnosis present

## 2014-03-13 DIAGNOSIS — R0781 Pleurodynia: Secondary | ICD-10-CM

## 2014-03-13 LAB — URINALYSIS, ROUTINE W REFLEX MICROSCOPIC
BILIRUBIN URINE: NEGATIVE
GLUCOSE, UA: NEGATIVE mg/dL
HGB URINE DIPSTICK: NEGATIVE
Ketones, ur: NEGATIVE mg/dL
Leukocytes, UA: NEGATIVE
NITRITE: NEGATIVE
PROTEIN: NEGATIVE mg/dL
SPECIFIC GRAVITY, URINE: 1.011 (ref 1.005–1.030)
Urobilinogen, UA: 1 mg/dL (ref 0.0–1.0)
pH: 7 (ref 5.0–8.0)

## 2014-03-13 LAB — PREGNANCY, URINE: Preg Test, Ur: NEGATIVE

## 2014-03-13 MED ORDER — CYCLOBENZAPRINE HCL 5 MG PO TABS: 5.0000 mg | ORAL_TABLET | Freq: Three times a day (TID) | ORAL | Status: DC | PRN

## 2014-03-13 MED ORDER — HYDROCODONE-ACETAMINOPHEN 5-325 MG PO TABS: 2.0000 | ORAL_TABLET | ORAL | Status: DC | PRN

## 2014-03-13 MED ORDER — IBUPROFEN 600 MG PO TABS: 600.0000 mg | ORAL_TABLET | Freq: Four times a day (QID) | ORAL | Status: DC | PRN

## 2014-03-13 MED ORDER — HYDROCODONE-ACETAMINOPHEN 5-325 MG PO TABS
2.0000 | ORAL_TABLET | Freq: Once | ORAL | Status: AC
  Administered 2014-03-13: 2 via ORAL
  Filled 2014-03-13: qty 2

## 2014-03-13 MED ORDER — CYCLOBENZAPRINE HCL 10 MG PO TABS
5.0000 mg | ORAL_TABLET | Freq: Once | ORAL | Status: AC
  Administered 2014-03-13: 5 mg via ORAL
  Filled 2014-03-13: qty 1

## 2014-03-13 NOTE — ED Provider Notes (Signed)
CSN: [id number]     Arrival date & time 03/13/14  1537 History  This chart was scribed for Richardean Canalavid H Yao, MD by Gwenyth Oberatherine Macek, ED Scribe. This patient was seen in room MH07/MH07 and the patient's care was started at 4:27 PM.    Chief Complaint  Patient presents with  . Hip Pain   The history is provided by the patient. No language interpreter was used.    HPI Comments: Heidi Hinton is a 24 y.o. female who presents to the Emergency Department complaining of intermittent, gradually worsening back pain that started 5 days ago. She states pain becomes worse and radiates to her chest with lying down. She also notes symptoms become worse with movement. She has been taking Motrin and Ibuprofen-600 mg with no relief. Pt states that she has a history of similar pain that occurred 1.5 months ago. 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 She also notes that her brother passed from Ewing's Sarcoma and she is worried about similar symptoms and pain. Pt denies recent falls or injuries. She also denies numbness in her legs.   Past Medical History  Diagnosis Date  . Anemia   . Rh negative status during pregnancy 04/2011  . LSIL (low grade squamous intraepithelial lesion) on Pap smear 03/11/2011  . Late prenatal care    Past Surgical History  Procedure Laterality Date  . Wisdom tooth extraction  2009   Family History  Problem Relation Age of Onset  . Alcohol abuse Mother   . Asthma Brother   . Cancer Brother     osteosarcoma  . Cancer Maternal Grandmother   . Mental illness Maternal Grandmother   . Alzheimer's disease Maternal Grandmother   . Anesthesia problems Neg Hx   . Hypotension Neg Hx   . Malignant hyperthermia Neg Hx   . Pseudochol deficiency Neg Hx    History  Substance Use Topics  . Smoking status: Never Smoker   . Smokeless tobacco: Never Used  . Alcohol Use: No   OB History    Gravida Para Term Preterm AB TAB SAB Ectopic Multiple  Living   2 2 2  0 0 0 0 0 0 2     Review of Systems  Musculoskeletal: Positive for back pain and arthralgias. Negative for joint swelling.  Skin: Negative for wound.  Neurological: Negative for numbness.  All other systems reviewed and are negative.  Allergies  Review of patient's allergies indicates no known allergies.  Home Medications   Prior to Admission medications   Medication Sig Start Date End Date Taking? Authorizing Provider  acetaminophen (TYLENOL) 500 MG tablet Take 1,000 mg by mouth every 6 (six) hours as needed.    Historical Provider, MD  ibuprofen (ADVIL,MOTRIN) 600 MG tablet Take 600 mg by mouth every 4 (four) hours as needed for moderate pain.    Historical Provider, MD  ibuprofen (ADVIL,MOTRIN) 600 MG tablet Take 1 tablet (600 mg total) by mouth every 8 (eight) hours as needed for moderate pain. 12/01/13   Maxine GlennAnn Batista, MD  metroNIDAZOLE (FLAGYL) 500 MG tablet Take 1 tablet (500 mg total) by mouth 2 (two) times daily. 12/01/13   Maxine GlennAnn Batista, MD   BP 133/72 mmHg  Pulse 79  Temp(Src) 98.2 F (36.8 C) (Oral)  Resp 16  Ht 5\' 8"  (1.727 m)  Wt 120 lb (54.432 kg)  BMI 18.25 kg/m2  SpO2 100%  LMP 03/07/2014 Physical Exam  Constitutional: She is  oriented to person, place, and time. She appears well-developed and well-nourished. No distress.  HENT:  Head: Normocephalic and atraumatic.  Eyes: Conjunctivae and EOM are normal.  Neck: Neck supple. No tracheal deviation present.  Cardiovascular: Normal rate and regular rhythm.   Pulmonary/Chest: Effort normal and breath sounds normal. No respiratory distress.  Musculoskeletal: Normal range of motion. She exhibits tenderness.  Left parathoracic tenderness; left-sided rib tenderness  Neurological: She is alert and oriented to person, place, and time.  No saddle anesthesia. Nl strength throughout   Skin: Skin is warm and dry.  Psychiatric: She has a normal mood and affect. Her behavior is normal.  Nursing note and vitals  reviewed.   ED Course  Procedures (including critical care time) DIAGNOSTIC STUDIES: Oxygen Saturation is 100% on RA, normal by my interpretation.    COORDINATION OF CARE: 4:33 PM Discussed treatment plan with pt which includes x-rays of her back. Pt agreed to plan.  Labs Review Labs Reviewed  URINALYSIS, ROUTINE W REFLEX MICROSCOPIC  PREGNANCY, URINE    Imaging Review Dg Chest 2 View  03/13/2014   CLINICAL DATA:  Left-sided chest pain for several days.  EXAM: CHEST  2 VIEW  COMPARISON:  None.  FINDINGS: The heart size and mediastinal contours are within normal limits. Both lungs are clear. There is a slight thoracolumbar scoliosis.  IMPRESSION: No acute abnormalities.   Electronically Signed   By: Geanie Cooley M.D.   On: 03/13/2014 16:55     EKG Interpretation None      MDM   Final diagnoses:  None    Heidi Hinton is a 24 y.o. female here with l sided rib pain that is reproducible. Likely muscle strain. Given vicodin, flexeril, felt better. Xray showed no fracture. Will dc home.   I personally performed the services described in this documentation, which was scribed in my presence. The recorded information has been reviewed and is accurate.    Richardean Canal, MD 03/13/14 [phone]

## 2014-03-13 NOTE — Discharge Instructions (Signed)
Take motrin for pain,   Take flexeril for muscle spasms.   Take vicodin for severe pain. Do NOT drive with it.   Follow up with your doctor.   Return to ER if you have severe pain, vomiting, chest pain.

## 2014-03-13 NOTE — ED Notes (Signed)
Pt having left side pain. She has seen her PCP and was treated for muscle inflammation. States pain is back, tender to touch, states it is painful to do any movement.

## 2014-03-13 NOTE — ED Notes (Signed)
Pt discharged home with sister.  Pt ambulatory at discharge in NAD.  Refused wheelchair.

## 2014-03-13 NOTE — ED Notes (Signed)
Patient reports she is here with her sister who will be driving her home.

## 2014-08-22 ENCOUNTER — Encounter (HOSPITAL_COMMUNITY): Payer: Self-pay | Admitting: Emergency Medicine

## 2014-08-22 ENCOUNTER — Emergency Department (HOSPITAL_COMMUNITY): Payer: Medicaid Other

## 2014-08-22 ENCOUNTER — Emergency Department (HOSPITAL_COMMUNITY)
Admission: EM | Admit: 2014-08-22 | Discharge: 2014-08-22 | Discharge status: Against Medical Advice | Payer: Medicaid Other | Attending: Emergency Medicine | Admitting: Emergency Medicine

## 2014-08-22 DIAGNOSIS — R011 Cardiac murmur, unspecified: Secondary | ICD-10-CM | POA: Diagnosis not present

## 2014-08-22 DIAGNOSIS — R079 Chest pain, unspecified: Secondary | ICD-10-CM | POA: Diagnosis present

## 2014-08-22 NOTE — ED Notes (Signed)
Pt c/o central chest pain that started today. Pt states that she has had this intermittently over the past couple weeks.  Pt states that she had heart murmur when she was younger.

## 2014-08-22 NOTE — ED Notes (Signed)
I called patient names for labs and got no responce

## 2014-08-22 NOTE — ED Notes (Signed)
Call patient name to collect labs and no answer.  I looked in bathroom and outside .  I made nurse aware.

## 2014-10-02 ENCOUNTER — Emergency Department (HOSPITAL_COMMUNITY)
Admission: EM | Admit: 2014-10-02 | Discharge: 2014-10-02 | Disposition: A | Discharge status: Home / Self Care | Payer: Medicaid Other | Attending: Emergency Medicine | Admitting: Emergency Medicine

## 2014-10-02 ENCOUNTER — Encounter (HOSPITAL_COMMUNITY): Payer: Self-pay | Admitting: *Deleted

## 2014-10-02 DIAGNOSIS — K029 Dental caries, unspecified: Secondary | ICD-10-CM

## 2014-10-02 DIAGNOSIS — K088 Other specified disorders of teeth and supporting structures: Secondary | ICD-10-CM | POA: Diagnosis present

## 2014-10-02 DIAGNOSIS — Z862 Personal history of diseases of the blood and blood-forming organs and certain disorders involving the immune mechanism: Secondary | ICD-10-CM | POA: Diagnosis not present

## 2014-10-02 DIAGNOSIS — K0889 Other specified disorders of teeth and supporting structures: Secondary | ICD-10-CM

## 2014-10-02 DIAGNOSIS — Z79899 Other long term (current) drug therapy: Secondary | ICD-10-CM | POA: Insufficient documentation

## 2014-10-02 MED ORDER — PENICILLIN V POTASSIUM 500 MG PO TABS: 500.0000 mg | ORAL_TABLET | Freq: Four times a day (QID) | ORAL | Status: AC

## 2014-10-02 NOTE — ED Notes (Addendum)
PT unsure when dental pain started. Pt has taken advil.without relief . Pt also reports swelling to Rt side of face.

## 2014-10-02 NOTE — Discharge Instructions (Signed)
Read the information below.  Use the prescribed medication as directed.  Please discuss all new medications with your pharmacist.  You may return to the Emergency Department at any time for worsening condition or any new symptoms that concern you.  Please call the dentist listed above within 48 hours to schedule a close follow up appointment.  If you develop fevers, swelling in your face, difficulty swallowing or breathing, return to the ER immediately for a recheck.     Dental Caries Dental caries (also called tooth decay) is the most common oral disease. It can occur at any age but is more common in children and young adults.  HOW DENTAL CARIES DEVELOPS  The process of decay begins when bacteria and foods (particularly sugars and starches) combine in your mouth to produce plaque. Plaque is a substance that sticks to the hard, outer surface of a tooth (enamel). The bacteria in plaque produce acids that attack enamel. These acids may also attack the root surface of a tooth (cementum) if it is exposed. Repeated attacks dissolve these surfaces and create holes in the tooth (cavities). If left untreated, the acids destroy the other layers of the tooth.  RISK FACTORS  Frequent sipping of sugary beverages.   Frequent snacking on sugary and starchy foods, especially those that easily get stuck in the teeth.   Poor oral hygiene.   Dry mouth.   Substance abuse such as methamphetamine abuse.   Broken or poor-fitting dental restorations.   Eating disorders.   Gastroesophageal reflux disease (GERD).   Certain radiation treatments to the head and neck. SYMPTOMS In the early stages of dental caries, symptoms are seldom present. Sometimes white, chalky areas may be seen on the enamel or other tooth layers. In later stages, symptoms may include:  Pits and holes on the enamel.  Toothache after sweet, hot, or cold foods or drinks are consumed.  Pain around the tooth.  Swelling around the  tooth. DIAGNOSIS  Most of the time, dental caries is detected during a regular dental checkup. A diagnosis is made after a thorough medical and dental history is taken and the surfaces of your teeth are checked for signs of dental caries. Sometimes special instruments, such as lasers, are used to check for dental caries. Dental X-ray exams may be taken so that areas not visible to the eye (such as between the contact areas of the teeth) can be checked for cavities.  TREATMENT  If dental caries is in its early stages, it may be reversed with a fluoride treatment or an application of a remineralizing agent at the dental office. Thorough brushing and flossing at home is needed to aid these treatments. If it is in its later stages, treatment depends on the location and extent of tooth destruction:   If a small area of the tooth has been destroyed, the destroyed area will be removed and cavities will be filled with a material such as gold, silver amalgam, or composite resin.   If a large area of the tooth has been destroyed, the destroyed area will be removed and a cap (crown) will be fitted over the remaining tooth structure.   If the center part of the tooth (pulp) is affected, a procedure called a root canal will be needed before a filling or crown can be placed.   If most of the tooth has been destroyed, the tooth may need to be pulled (extracted). HOME CARE INSTRUCTIONS You can prevent, stop, or reverse dental caries at home  by practicing good oral hygiene. Good oral hygiene includes:  Thoroughly cleaning your teeth at least twice a day with a toothbrush and dental floss.   Using a fluoride toothpaste. A fluoride mouth rinse may also be used if recommended by your dentist or health care provider.   Restricting the amount of sugary and starchy foods and sugary liquids you consume.   Avoiding frequent snacking on these foods and sipping of these liquids.   Keeping regular visits with a  dentist for checkups and cleanings. PREVENTION   Practice good oral hygiene.  Consider a dental sealant. A dental sealant is a coating material that is applied by your dentist to the pits and grooves of teeth. The sealant prevents food from being trapped in them. It may protect the teeth for several years.  Ask about fluoride supplements if you live in a community without fluorinated water or with water that has a low fluoride content. Use fluoride supplements as directed by your dentist or health care provider.  Allow fluoride varnish applications to teeth if directed by your dentist or health care provider. Document Released: 10/13/2001 Document Revised: 06/07/2013 Document Reviewed: 01/24/2012 [facility] Patient Information 2015 [place], [place]. This information is not intended to replace advice given to you by your health care provider. Make sure you discuss any questions you have with your health care provider.  Dental Pain A tooth ache may be caused by cavities (tooth decay). Cavities expose the nerve of the tooth to air and hot or cold temperatures. It may come from an infection or abscess (also called a boil or furuncle) around your tooth. It is also often caused by dental caries (tooth decay). This causes the pain you are having. DIAGNOSIS  Your caregiver can diagnose this problem by exam. TREATMENT   If caused by an infection, it may be treated with medications which kill germs (antibiotics) and pain medications as prescribed by your caregiver. Take medications as directed.  Only take over-the-counter or prescription medicines for pain, discomfort, or fever as directed by your caregiver.  Whether the tooth ache today is caused by infection or dental disease, you should see your dentist as soon as possible for further care. SEEK MEDICAL CARE IF: The exam and treatment you received today has been provided on an emergency basis only. This is not a substitute for complete medical or  dental care. If your problem worsens or new problems (symptoms) appear, and you are unable to meet with your dentist, call or return to this location. SEEK IMMEDIATE MEDICAL CARE IF:   You have a fever.  You develop redness and swelling of your face, jaw, or neck.  You are unable to open your mouth.  You have severe pain uncontrolled by pain medicine. MAKE SURE YOU:   Understand these instructions.  Will watch your condition.  Will get help right away if you are not doing well or get worse. Document Released: 01/21/2005 Document Revised: 04/15/2011 Document Reviewed: 09/09/2007 [facility] Patient Information 2015 [place], [place]. This information is not intended to replace advice given to you by your health care provider. Make sure you discuss any questions you have with your health care provider.

## 2014-10-02 NOTE — ED Notes (Signed)
Declined W/C at D/C and was escorted to lobby by RN. 

## 2014-10-02 NOTE — ED Provider Notes (Signed)
CSN: [id number]   Arrival date & time 10/02/14 1112  History  This chart was scribed for non-physician practitioner, Trixie Dredge PA-C, working with Marily Memos, MD by Bethel Born, ED Scribe. This patient was seen in room TR05C/TR05C and the patient's care was started at 11:51 AM.  Chief Complaint  Patient presents with  . Dental Pain    HPI The history is provided by the patient. No language interpreter was used.   Heidi Hinton is a 24 y.o. female who presents to the Emergency Department complaining of constant  Bilateral upper dental pain (R>L) with onset over 1 year ago. The pain worsened 2 weeks ago and significantly worsened this morning. The pain is described as "dinging" like a bell and rated 10/10 in severity. Tylenol, ibuprofen, and Goody Powder provided insufficient pain relief PTA. Pt denies fever, chills, sore throat, ear pain, muscle aches, SOB, nausea, and vomiting .  Pt is planning to see her dentist, Dr Chilton Si, this week.     Past Medical History  Diagnosis Date  . Anemia   . Rh negative status during pregnancy 04/2011  . LSIL (low grade squamous intraepithelial lesion) on Pap smear 03/11/2011  . Late prenatal care     Past Surgical History  Procedure Laterality Date  . Wisdom tooth extraction  2009    Family History  Problem Relation Age of Onset  . Alcohol abuse Mother   . Asthma Brother   . Cancer Brother     osteosarcoma  . Cancer Maternal Grandmother   . Mental illness Maternal Grandmother   . Alzheimer's disease Maternal Grandmother   . Anesthesia problems Neg Hx   . Hypotension Neg Hx   . Malignant hyperthermia Neg Hx   . Pseudochol deficiency Neg Hx     Social History  Substance Use Topics  . Smoking status: Never Smoker   . Smokeless tobacco: Never Used  . Alcohol Use: No     Review of Systems  Constitutional: Negative for fever and chills.  HENT: Negative for ear pain and sore throat.        Right upper dental pain  Respiratory:  Negative for shortness of breath.   Gastrointestinal: Negative for nausea and vomiting.  Musculoskeletal: Negative for myalgias.  Allergic/Immunologic: Negative for immunocompromised state.  Hematological: Does not bruise/bleed easily.    Home Medications   Prior to Admission medications   Medication Sig Start Date End Date Taking? Authorizing Provider  acetaminophen (TYLENOL) 500 MG tablet Take 1,000 mg by mouth every 6 (six) hours as needed.    Historical Provider, MD  cyclobenzaprine (FLEXERIL) 5 MG tablet Take 1 tablet (5 mg total) by mouth 3 (three) times daily as needed for muscle spasms. 03/13/14   Richardean Canal, MD  HYDROcodone-acetaminophen (NORCO/VICODIN) 5-325 MG per tablet Take 2 tablets by mouth every 4 (four) hours as needed for moderate pain or severe pain. 03/13/14   Richardean Canal, MD  ibuprofen (ADVIL,MOTRIN) 600 MG tablet Take 1 tablet (600 mg total) by mouth every 6 (six) hours as needed. 03/13/14   Richardean Canal, MD  metroNIDAZOLE (FLAGYL) 500 MG tablet Take 1 tablet (500 mg total) by mouth 2 (two) times daily. 12/01/13   Maxine Glenn, MD    Allergies  Review of patient's allergies indicates no known allergies.  Triage Vitals: BP 128/64 mmHg  Pulse 59  Temp(Src) 98.2 F (36.8 C) (Oral)  Resp 16  SpO2 100%  Physical Exam  Constitutional: She appears well-developed and well-nourished.  No distress.  HENT:  Head: Normocephalic and atraumatic.  Mouth/Throat: Uvula is midline and oropharynx is clear and moist. Mucous membranes are not dry. No uvula swelling. No oropharyngeal exudate, posterior oropharyngeal edema, posterior oropharyngeal erythema or tonsillar abscesses.  Left and right upper first  molars with significant decay deep into the gingiva and tenderness to percussion.  White plaque on right buccal mucosa that doesn't scrape off.  Neck: Trachea normal, normal range of motion and phonation normal. Neck supple. No tracheal tenderness present. No rigidity. No tracheal  deviation, no edema, no erythema and normal range of motion present.  Cardiovascular: Normal rate.   Pulmonary/Chest: Effort normal and breath sounds normal. No stridor.  Lymphadenopathy:    She has no cervical adenopathy.  Neurological: She is alert.  Skin: She is not diaphoretic.  Nursing note and vitals reviewed.   ED Course  Procedures  DIAGNOSTIC STUDIES: Oxygen Saturation is 100% on RA,  normal by my interpretation.    COORDINATION OF CARE: 12:45 PM Discussed treatment plan which includes discharge with abx with pt at bedside and pt agreed to plan.  Labs Review- Labs Reviewed - No data to display  Imaging Review No results found.   EKG Interpretation None      MDM   Final diagnoses:  Pain, dental  Dental decay    Afebrile, nontoxic patient with new dental pain.  No obvious abscess.  No concerning findings on exam.  Doubt deep space head or neck infection.  Doubt Ludwig's angina.  D/C home with antibiotic and dental follow up.  Discussed findings, treatment, and follow up  with patient.  Pt given return precautions.  Pt verbalizes understanding and agrees with plan.       I personally performed the services described in this documentation, which was scribed in my presence. The recorded information has been reviewed and is accurate.      Trixie Dredge, PA-C 10/02/14 1302  Marily Memos, MD 10/02/14 [phone]

## 2015-05-18 ENCOUNTER — Other Ambulatory Visit: Payer: Self-pay | Admitting: Pediatrics

## 2015-05-18 MED ORDER — PERMETHRIN 5 % EX CREA: 1.0000 "application " | TOPICAL_CREAM | Freq: Once | CUTANEOUS | Status: DC

## 2015-05-18 NOTE — Progress Notes (Signed)
Treating household contact for scabies.

## 2016-01-06 ENCOUNTER — Encounter (HOSPITAL_COMMUNITY): Payer: Self-pay | Admitting: *Deleted

## 2016-01-06 ENCOUNTER — Emergency Department (HOSPITAL_COMMUNITY)
Admission: EM | Admit: 2016-01-06 | Discharge: 2016-01-06 | Disposition: A | Discharge status: Home / Self Care | Payer: Medicaid Other | Attending: Emergency Medicine | Admitting: Emergency Medicine

## 2016-01-06 DIAGNOSIS — K122 Cellulitis and abscess of mouth: Secondary | ICD-10-CM | POA: Diagnosis present

## 2016-01-06 DIAGNOSIS — K05219 Aggressive periodontitis, localized, unspecified severity: Secondary | ICD-10-CM | POA: Insufficient documentation

## 2016-01-06 DIAGNOSIS — Z87891 Personal history of nicotine dependence: Secondary | ICD-10-CM | POA: Diagnosis not present

## 2016-01-06 MED ORDER — LIDOCAINE HCL (PF) 1 % IJ SOLN
2.0000 mL | Freq: Once | INTRAMUSCULAR | Status: AC
  Administered 2016-01-06: 2 mL
  Filled 2016-01-06: qty 5

## 2016-01-06 MED ORDER — BUPIVACAINE HCL (PF) 0.5 % IJ SOLN
10.0000 mL | Freq: Once | INTRAMUSCULAR | Status: DC
  Filled 2016-01-06: qty 10

## 2016-01-06 MED ORDER — IBUPROFEN 800 MG PO TABS: 800.0000 mg | ORAL_TABLET | Freq: Three times a day (TID) | ORAL | 0 refills | Status: DC

## 2016-01-06 MED ORDER — PENICILLIN V POTASSIUM 500 MG PO TABS: 500.0000 mg | ORAL_TABLET | Freq: Four times a day (QID) | ORAL | 0 refills | Status: AC

## 2016-01-06 NOTE — ED Notes (Signed)
Pt understood dc material. NAD noted. Scripts given at dc 

## 2016-01-06 NOTE — Discharge Instructions (Signed)
Medications: Penicillin, ibuprofen  Treatment: Take penicillin as prescribed for 7 days. Take ibuprofen 3 times daily as needed for your pain. You can use gauze in your mouth to soak up any blood. Swish with warm salt water 2 or 3 times a day.  Follow-up: Please follow-up with your dentist in 3-4 days for wound recheck. Please return to emergency department if you develop any new or worsening symptoms including increasing pain, swelling, excessive drainage, fevers over 100.4, or any other concerning symptoms.

## 2016-01-06 NOTE — ED Triage Notes (Signed)
Patient presents stating she has noticed an absess to the right upper mouth

## 2016-01-06 NOTE — ED Provider Notes (Signed)
[facility]-EMERGENCY DEPT Provider Note   CSN: [id number] Arrival date & time: 01/06/16  2031  By signing my name below, I, Valentino SaxonBianca Contreras, attest that this documentation has been prepared under the direction and in the presence of Buel ReamAlexandra Cinde Ebert, [place]. Electronically Signed: Valentino SaxonBianca Contreras, ED Scribe. 01/06/16. 9:16 PM.  History   Chief Complaint Chief Complaint  Patient presents with  . Mouth abcess   The history is provided by the patient. No language interpreter was used.   HPI Comments: Heidi Hinton is a 25 y.o. female who presents to the Emergency Department complaining of moderate, constant, mouth abscess onset yesterday. Pt reports associated pain and irritation on her upper right gum accompanied by facial swelling on her right side. She states she noticed her gum was sore after brushing her teeth last night. Pt reports washing her mouth out with Listerine last night but noticed an increase in size with abscess this morning. She notes rinsing her mouth with salt water twice today with no relief. Pt noticed her gum pain was worsened with salt water rinse and an increased irritation to the affected area. No modifying factors noted. She denies tooth pain, nausea, vomiting, abdominal pain. No additional complaints at this time.   Past Medical History:  Diagnosis Date  . Anemia   . Late prenatal care   . LSIL (low grade squamous intraepithelial lesion) on Pap smear 03/11/2011  . Rh negative status during pregnancy 04/2011    Patient Active Problem List   Diagnosis Date Noted  . NVD (normal vaginal delivery) 06/22/2013  . Active labor at term 06/21/2013  . Marijuana use 06/17/2013  . Group B Streptococcus carrier, +RV culture, currently pregnant 06/14/2013  . Maternal thrombocytopenia complicating pregnancy in third trimester 06/22/2011  . Late prenatal care 05/22/2011  . Rh negative status during pregnancy 05/22/2011    Past Surgical History:  Procedure Laterality Date  .  WISDOM TOOTH EXTRACTION  2009    OB History    Gravida Para Term Preterm AB Living   2 2 2  0 0 2   SAB TAB Ectopic Multiple Live Births   0 0 0 0 2       Home Medications    Prior to Admission medications   Medication Sig Start Date End Date Taking? Authorizing Provider  acetaminophen (TYLENOL) 500 MG tablet Take 1,000 mg by mouth every 6 (six) hours as needed.    Historical Provider, MD  cyclobenzaprine (FLEXERIL) 5 MG tablet Take 1 tablet (5 mg total) by mouth 3 (three) times daily as needed for muscle spasms. 03/13/14   Charlynne Panderavid Hsienta Yao, MD  HYDROcodone-acetaminophen (NORCO/VICODIN) 5-325 MG per tablet Take 2 tablets by mouth every 4 (four) hours as needed for moderate pain or severe pain. 03/13/14   Charlynne Panderavid Hsienta Yao, MD  ibuprofen (ADVIL,MOTRIN) 800 MG tablet Take 1 tablet (800 mg total) by mouth 3 (three) times daily. 01/06/16   Emi HolesAlexandra M Vaniah Chambers, PA-C  metroNIDAZOLE (FLAGYL) 500 MG tablet Take 1 tablet (500 mg total) by mouth 2 (two) times daily. 12/01/13   Maxine GlennAnn Batista, MD  penicillin v potassium (VEETID) 500 MG tablet Take 1 tablet (500 mg total) by mouth 4 (four) times daily. 01/06/16 01/13/16  Waylan BogaAlexandra M Lakeisa Heninger, PA-C  permethrin (ELIMITE) 5 % cream Apply 1 application topically once. Apply and leave on for 8-14 hours (overnight) then wash off. Repeat in 1-2 weeks. 05/18/15   Jonetta OsgoodKirsten Brown, MD    Family History Family History  Problem Relation Age of Onset  .  Alcohol abuse Mother   . Asthma Brother   . Cancer Brother     osteosarcoma  . Cancer Maternal Grandmother   . Mental illness Maternal Grandmother   . Alzheimer's disease Maternal Grandmother   . Anesthesia problems Neg Hx   . Hypotension Neg Hx   . Malignant hyperthermia Neg Hx   . Pseudochol deficiency Neg Hx     Social History Social History  Substance Use Topics  . Smoking status: Former Smoker    Quit date: 12/07/2015  . Smokeless tobacco: Never Used  . Alcohol use No     Allergies   Patient has no known  allergies.   Review of Systems Review of Systems  Constitutional: Positive for fever. Negative for chills.  HENT: Positive for facial swelling (right side ) and mouth sores (right side ). Negative for dental problem and sore throat.   Respiratory: Negative for shortness of breath.   Cardiovascular: Negative for chest pain.  Gastrointestinal: Negative for abdominal pain, nausea and vomiting.  Genitourinary: Negative for dysuria.  Musculoskeletal: Negative for back pain.  Skin: Negative for rash and wound.  Neurological: Negative for headaches.  Psychiatric/Behavioral: The patient is not nervous/anxious.      Physical Exam Updated Vital Signs BP 139/81 (BP Location: Left Arm)   Pulse 71   Temp 99.4 F (37.4 C) (Oral)   Resp 16   LMP 12/15/2015   SpO2 100%   Physical Exam  Constitutional: She appears well-developed and well-nourished. No distress.  HENT:  Head: Normocephalic and atraumatic.  Mouth/Throat: Oropharynx is clear and moist and mucous membranes are normal. No trismus in the jaw. No oropharyngeal exudate.  1.5 cm abscess on gingiva over the first molar with fluctuance and tenderness; no tenderness to the tooth  Eyes: Conjunctivae are normal. Pupils are equal, round, and reactive to light. Right eye exhibits no discharge. Left eye exhibits no discharge. No scleral icterus.  Neck: Normal range of motion. Neck supple. No thyromegaly present.  Cardiovascular: Normal rate, regular rhythm, normal heart sounds and intact distal pulses.  Exam reveals no gallop and no friction rub.   No murmur heard. Pulmonary/Chest: Effort normal and breath sounds normal. No stridor. No respiratory distress. She has no wheezes. She has no rales.  Abdominal: Soft. Bowel sounds are normal. She exhibits no distension. There is no tenderness. There is no rebound and no guarding.  Musculoskeletal: She exhibits no edema.  Lymphadenopathy:    She has no cervical adenopathy.  Neurological: She is  alert. Coordination normal.  Skin: Skin is warm and dry. No rash noted. She is not diaphoretic. No pallor.  Psychiatric: She has a normal mood and affect.  Nursing note and vitals reviewed.    ED Treatments / Results    DIAGNOSTIC STUDIES: Oxygen Saturation is 100% on RA, normal by my interpretation.    COORDINATION OF CARE: 9:07 PM Discussed treatment plan with pt at bedside which includes Xylocaine, antibiotics and f/u with dentist and pt agreed to plan.   Labs (all labs ordered are listed, but only abnormal results are displayed) Labs Reviewed - No data to display  EKG  EKG Interpretation None       Radiology No results found.  Procedures Procedures (including critical care time)  INCISION AND DRAINAGE Performed by: Emi HolesAlexandra M Akaya Proffit Consent: Verbal consent obtained. Risks and benefits: risks, benefits and alternatives were discussed Type: abscess  Body area: Upper right gingiva  Anesthesia: local infiltration  Incision was made with a scalpel.  Local anesthetic: lidocaine 1% w/o epinephrine  Anesthetic total: 1 ml  Complexity: complex Blunt dissection to break up loculations  Drainage: purulent, bloody  Drainage amount: moderate  Packing material: not large enough to warrant packing  Patient tolerance: Patient tolerated the procedure well with no immediate complications.     Medications Ordered in ED Medications  lidocaine (PF) (XYLOCAINE) 1 % injection 2 mL (not administered)     Initial Impression / Assessment and Plan / ED Course  I have reviewed the triage vital signs and the nursing notes.  Pertinent labs & imaging results that were available during my care of the patient were reviewed by me and considered in my medical decision making (see chart for details).  Clinical Course     Patient with gingival abscess. Incision and drainage performed in the ED today.  Abscess was not large enough to warrant packing or drain placement. Wound  recheck in 2-3 days by dentist. Supportive care and return precautions discussed.  Pt sent home with penicillin and ibuprofen. Patient advised to use warm salt water rinses as well. Patient understands and agrees with plan. Patient vitals stable throughout ED course and discharged in satisfactory condition. I discussed patient case with Dr. Jacqulyn Bath who guided the patient's management and agrees with plan.    Final Clinical Impressions(s) / ED Diagnoses   Final diagnoses:  Gingival abscess    New Prescriptions New Prescriptions   IBUPROFEN (ADVIL,MOTRIN) 800 MG TABLET    Take 1 tablet (800 mg total) by mouth 3 (three) times daily.   PENICILLIN V POTASSIUM (VEETID) 500 MG TABLET    Take 1 tablet (500 mg total) by mouth 4 (four) times daily.    I personally performed the services described in this documentation, which was scribed in my presence. The recorded information has been reviewed and is accurate.     Emi Holes, PA-C 01/06/16 2215    Maia Plan, MD 01/07/16 [phone]

## 2016-09-03 IMAGING — CR DG CHEST 2V
2 series · 2 of 2 positions shown · non-contrast
Comparison: None.

CLINICAL DATA: Left-sided chest pain for several days.

EXAM:
CHEST  2 VIEW

[w chest pa]
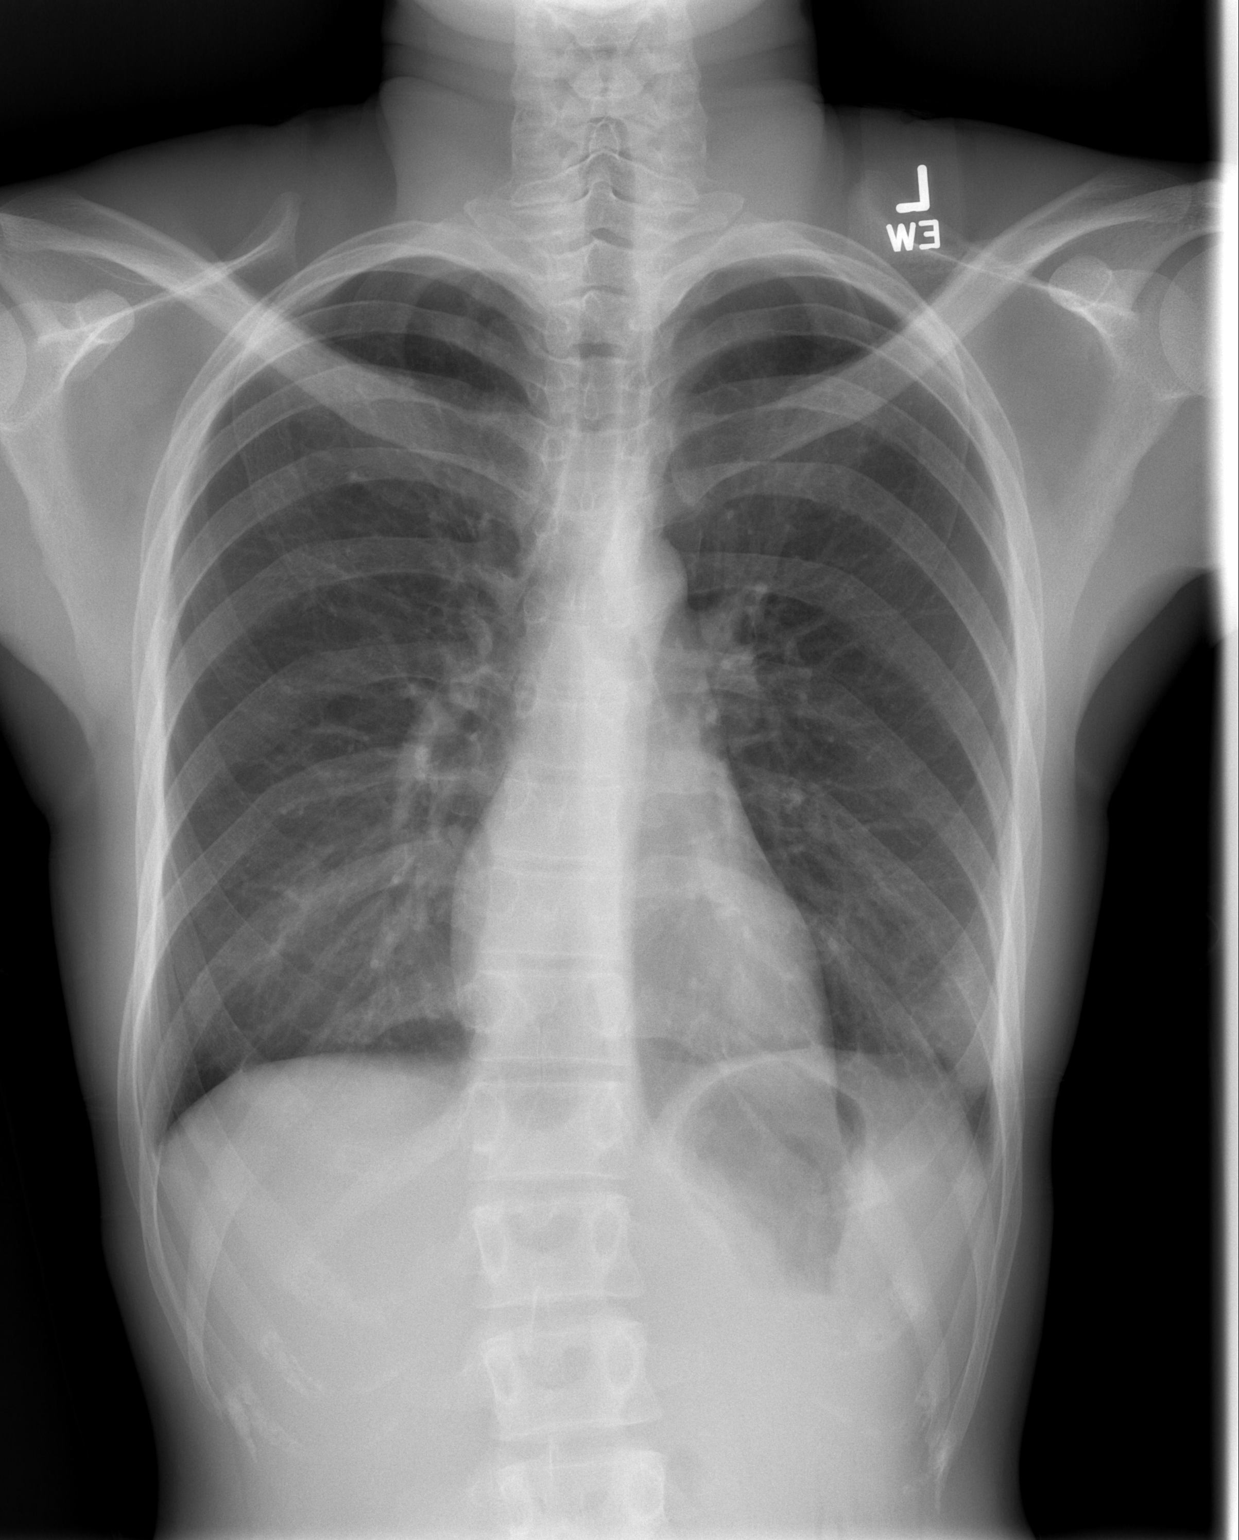

[w chest lat]
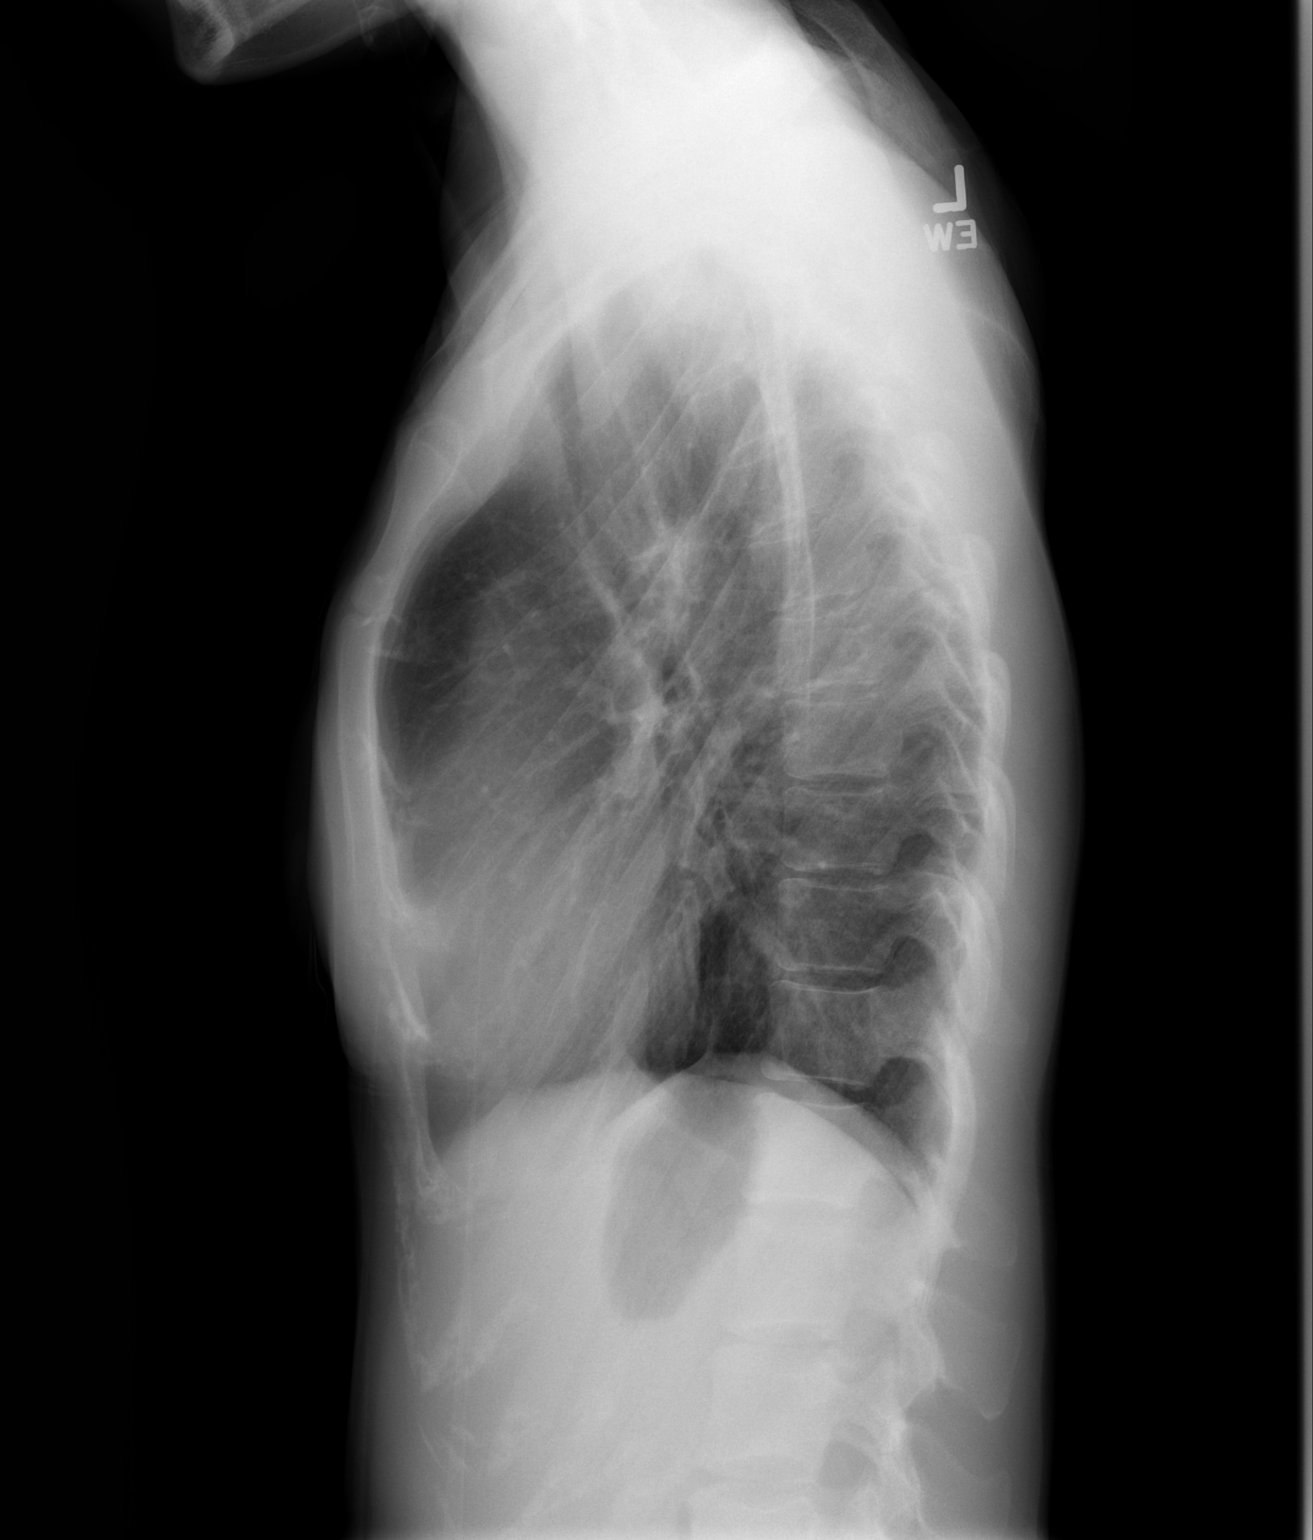

[2 of 2 positions shown; findings below may reference images not displayed]

FINDINGS: The heart size and mediastinal contours are within normal limits.
Both lungs are clear. There is a slight thoracolumbar scoliosis.
IMPRESSION: No acute abnormalities.

## 2016-10-03 ENCOUNTER — Encounter (HOSPITAL_COMMUNITY): Payer: Self-pay

## 2016-10-03 ENCOUNTER — Emergency Department (HOSPITAL_COMMUNITY)
Admission: EM | Admit: 2016-10-03 | Discharge: 2016-10-03 | Disposition: A | Discharge status: Home / Self Care | Payer: Medicaid Other | Attending: Emergency Medicine | Admitting: Emergency Medicine

## 2016-10-03 DIAGNOSIS — Z79899 Other long term (current) drug therapy: Secondary | ICD-10-CM | POA: Insufficient documentation

## 2016-10-03 DIAGNOSIS — Z87891 Personal history of nicotine dependence: Secondary | ICD-10-CM | POA: Diagnosis not present

## 2016-10-03 DIAGNOSIS — K0889 Other specified disorders of teeth and supporting structures: Secondary | ICD-10-CM | POA: Diagnosis not present

## 2016-10-03 MED ORDER — PENICILLIN V POTASSIUM 500 MG PO TABS: 500.0000 mg | ORAL_TABLET | Freq: Four times a day (QID) | ORAL | 0 refills | Status: AC

## 2016-10-03 NOTE — Discharge Instructions (Signed)
Read the information below.  Use the prescribed medication as directed.  Please discuss all new medications with your pharmacist.  You may return to the Emergency Department at any time for worsening condition or any new symptoms that concern you.   If you develop fevers, swelling in your face, difficulty swallowing or breathing, return to the ER immediately for a recheck.   °

## 2016-10-03 NOTE — ED Provider Notes (Signed)
[facility] Provider Note   CSN: [id number] Arrival date & time: 10/03/16  [id number]     History   Chief Complaint Chief Complaint  Patient presents with  . Dental Pain    HPI Heidi Hinton is a 26 y.o. female.  HPI   Pt p/w worsening dental pain for the past week.  States she has had problems with her teeth for the last 3 years, since her last pregnancy.  Her left upper molar has had filling placed, then filling fell out, and has had worsening decay over years.  Pain worsened this week.  Denies fevers, facial swelling, sore throat, difficulty swallowing or breathing.     Past Medical History:  Diagnosis Date  . Anemia   . Late prenatal care   . LSIL (low grade squamous intraepithelial lesion) on Pap smear 03/11/2011  . Rh negative status during pregnancy 04/2011    Patient Active Problem List   Diagnosis Date Noted  . NVD (normal vaginal delivery) 06/22/2013  . Active labor at term 06/21/2013  . Marijuana use 06/17/2013  . Group B Streptococcus carrier, +RV culture, currently pregnant 06/14/2013  . Maternal thrombocytopenia complicating pregnancy in third trimester 06/22/2011  . Late prenatal care 05/22/2011  . Rh negative status during pregnancy 05/22/2011    Past Surgical History:  Procedure Laterality Date  . WISDOM TOOTH EXTRACTION  2009    OB History    Gravida Para Term Preterm AB Living   2 2 2  0 0 2   SAB TAB Ectopic Multiple Live Births   0 0 0 0 2       Home Medications    Prior to Admission medications   Medication Sig Start Date End Date Taking? Authorizing Provider  acetaminophen (TYLENOL) 500 MG tablet Take 1,000 mg by mouth every 6 (six) hours as needed.    [provider]  cyclobenzaprine (FLEXERIL) 5 MG tablet Take 1 tablet (5 mg total) by mouth 3 (three) times daily as needed for muscle spasms. 03/13/14   Charlynne PanderYao, David Hsienta, MD  HYDROcodone-acetaminophen (NORCO/VICODIN) 5-325 MG per tablet Take 2 tablets by mouth every 4  (four) hours as needed for moderate pain or severe pain. 03/13/14   Charlynne PanderYao, David Hsienta, MD  ibuprofen (ADVIL,MOTRIN) 800 MG tablet Take 1 tablet (800 mg total) by mouth 3 (three) times daily. 01/06/16   Law, Waylan BogaAlexandra M, PA-C  metroNIDAZOLE (FLAGYL) 500 MG tablet Take 1 tablet (500 mg total) by mouth 2 (two) times daily. 12/01/13   Maxine GlennBatista, Ann, MD  penicillin v potassium (VEETID) 500 MG tablet Take 1 tablet (500 mg total) by mouth 4 (four) times daily. 10/03/16 10/10/16  Trixie DredgeWest, Foster Frericks, PA-C  permethrin (ELIMITE) 5 % cream Apply 1 application topically once. Apply and leave on for 8-14 hours (overnight) then wash off. Repeat in 1-2 weeks. 05/18/15   Jonetta OsgoodBrown, Kirsten, MD    Family History Family History  Problem Relation Age of Onset  . Alcohol abuse Mother   . Asthma Brother   . Cancer Brother        osteosarcoma  . Cancer Maternal Grandmother   . Mental illness Maternal Grandmother   . Alzheimer's disease Maternal Grandmother   . Anesthesia problems Neg Hx   . Hypotension Neg Hx   . Malignant hyperthermia Neg Hx   . Pseudochol deficiency Neg Hx     Social History Social History  Substance Use Topics  . Smoking status: Former Smoker    Quit date: 12/07/2015  . Smokeless  tobacco: Never Used  . Alcohol use No     Allergies   Patient has no known allergies.   Review of Systems Review of Systems  Constitutional: Negative for appetite change, chills and fever.  HENT: Positive for dental problem. Negative for congestion, ear pain, mouth sores, rhinorrhea, sore throat and trouble swallowing.   Eyes: Negative for discharge.  Respiratory: Negative for cough, shortness of breath, wheezing and stridor.   Cardiovascular: Negative for chest pain.  Musculoskeletal: Negative for neck pain and neck stiffness.     Physical Exam Updated Vital Signs BP 129/79 (BP Location: Right Arm)   Pulse 60   Temp 98.1 F (36.7 C) (Oral)   Resp 18   LMP 09/30/2016 (Within Days)   SpO2 100%   Physical  Exam  Constitutional: She appears well-developed and well-nourished. No distress.  HENT:  Head: Normocephalic and atraumatic.  Mouth/Throat: Uvula is midline and oropharynx is clear and moist. Mucous membranes are not dry. No uvula swelling. No oropharyngeal exudate, posterior oropharyngeal edema, posterior oropharyngeal erythema or tonsillar abscesses.  Left upper first molar with severe, deep decay, rim of erythema around base of tooth, tenderness to percussion, adjacent gingiva is tender.  No facial swelling.    Neck: Trachea normal, normal range of motion and phonation normal. Neck supple. No tracheal tenderness present. No neck rigidity. No tracheal deviation, no edema, no erythema and normal range of motion present.  Cardiovascular: Normal rate.   Pulmonary/Chest: Effort normal and breath sounds normal. No stridor.  Lymphadenopathy:    She has no cervical adenopathy.  Neurological: She is alert.  Skin: She is not diaphoretic.  Nursing note and vitals reviewed.    ED Treatments / Results  Labs (all labs ordered are listed, but only abnormal results are displayed) Labs Reviewed - No data to display  EKG  EKG Interpretation None       Radiology No results found.  Procedures Procedures (including critical care time)  Medications Ordered in ED Medications - No data to display   Initial Impression / Assessment and Plan / ED Course  I have reviewed the triage vital signs and the nursing notes.  Pertinent labs & imaging results that were available during my care of the patient were reviewed by me and considered in my medical decision making (see chart for details).     Afebrile, nontoxic patient with new dental pain.  No obvious abscess.  No concerning findings on exam.  Doubt deep space head or neck infection.  Doubt Ludwig's angina.  D/C home with antibiotic and dental follow up.  Discussed findings, treatment, and follow up  with patient.  Pt given return precautions.   Pt verbalizes understanding and agrees with plan.      Final Clinical Impressions(s) / ED Diagnoses   Final diagnoses:  Pain, dental    New Prescriptions New Prescriptions   PENICILLIN V POTASSIUM (VEETID) 500 MG TABLET    Take 1 tablet (500 mg total) by mouth 4 (four) times daily.     Trixie Dredge, PA-C 10/03/16 0930    Tilden Fossa, MD 10/04/16 [phone]

## 2016-10-03 NOTE — ED Notes (Signed)
Left upper jaw  pain  X several weeks states there is  No tooth was to see a dentist but but work would not let her off, states she was going to reschedule but hurts to bad

## 2016-10-03 NOTE — ED Triage Notes (Signed)
Pt states she has had a broken tooth X1 month. Pt states that the tooth was not initially bothering her but has had pain X2 weeks. No facial swelling noted.

## 2017-02-04 NOTE — L&D Delivery Note (Signed)
Delivery Note Patient pushed for approximately an hour after she was noted to be C/C/0. At 2:04 PM a viable and healthy female was delivered via Vaginal, Spontaneous (Presentation: OP).  APGAR: 9, 9; weight pending. Shoulders and body easily delivered.  Baby laid on maternal abdomen And was noted to have a vigorous cry.  Cord double clamped and cut by Father.  Cord blood obtained.    Placenta delivered spontaneously, intact with 3 vessels noted. There were no lacerations noted.  Uterine atony alleviated by IV pitocin and massage. Patient tolerated delivery well.   Anesthesia:  Epidural Episiotomy: None Lacerations: None Suture Repair: n/a Est. Blood Loss (mL): 300  Mom to postpartum.  Baby to Couplet care / Skin to Skin.  Essie Hart STACIA 06/13/2017, 2:20 PM

## 2017-06-02 LAB — OB RESULTS CONSOLE GBS: STREP GROUP B AG: NEGATIVE

## 2017-06-03 ENCOUNTER — Other Ambulatory Visit (HOSPITAL_COMMUNITY): Payer: Self-pay | Admitting: Obstetrics and Gynecology

## 2017-06-03 DIAGNOSIS — O26849 Uterine size-date discrepancy, unspecified trimester: Secondary | ICD-10-CM

## 2017-06-12 ENCOUNTER — Ambulatory Visit (HOSPITAL_COMMUNITY)
Admission: RE | Admit: 2017-06-12 | Discharge: 2017-06-12 | Disposition: A | Discharge status: Home / Self Care | Payer: Medicaid Other | Source: Ambulatory Visit | Attending: Obstetrics and Gynecology | Admitting: Obstetrics and Gynecology

## 2017-06-12 ENCOUNTER — Other Ambulatory Visit (HOSPITAL_COMMUNITY): Payer: Self-pay | Admitting: Obstetrics and Gynecology

## 2017-06-12 DIAGNOSIS — Z3A37 37 weeks gestation of pregnancy: Secondary | ICD-10-CM

## 2017-06-12 DIAGNOSIS — Z3689 Encounter for other specified antenatal screening: Secondary | ICD-10-CM | POA: Insufficient documentation

## 2017-06-12 DIAGNOSIS — O0933 Supervision of pregnancy with insufficient antenatal care, third trimester: Secondary | ICD-10-CM | POA: Insufficient documentation

## 2017-06-12 DIAGNOSIS — O26849 Uterine size-date discrepancy, unspecified trimester: Secondary | ICD-10-CM

## 2017-06-12 DIAGNOSIS — O26843 Uterine size-date discrepancy, third trimester: Secondary | ICD-10-CM

## 2017-06-13 ENCOUNTER — Inpatient Hospital Stay (HOSPITAL_COMMUNITY): Payer: Medicaid Other | Admitting: Anesthesiology

## 2017-06-13 ENCOUNTER — Inpatient Hospital Stay (HOSPITAL_COMMUNITY)
Admission: AD | Admit: 2017-06-13 | Discharge: 2017-06-15 | DRG: 807 | Disposition: A | Discharge status: Home / Self Care | Payer: Medicaid Other | Source: Ambulatory Visit | Attending: Obstetrics & Gynecology | Admitting: Obstetrics & Gynecology

## 2017-06-13 ENCOUNTER — Encounter (HOSPITAL_COMMUNITY): Payer: Self-pay

## 2017-06-13 DIAGNOSIS — Z3A37 37 weeks gestation of pregnancy: Secondary | ICD-10-CM | POA: Diagnosis not present

## 2017-06-13 DIAGNOSIS — D573 Sickle-cell trait: Secondary | ICD-10-CM | POA: Diagnosis present

## 2017-06-13 DIAGNOSIS — O9902 Anemia complicating childbirth: Principal | ICD-10-CM | POA: Diagnosis present

## 2017-06-13 DIAGNOSIS — O99824 Streptococcus B carrier state complicating childbirth: Secondary | ICD-10-CM | POA: Diagnosis present

## 2017-06-13 DIAGNOSIS — Z6791 Unspecified blood type, Rh negative: Secondary | ICD-10-CM

## 2017-06-13 DIAGNOSIS — O26893 Other specified pregnancy related conditions, third trimester: Secondary | ICD-10-CM | POA: Diagnosis present

## 2017-06-13 DIAGNOSIS — Z87891 Personal history of nicotine dependence: Secondary | ICD-10-CM

## 2017-06-13 DIAGNOSIS — Z3483 Encounter for supervision of other normal pregnancy, third trimester: Secondary | ICD-10-CM | POA: Diagnosis present

## 2017-06-13 LAB — OB RESULTS CONSOLE GC/CHLAMYDIA
CHLAMYDIA, DNA PROBE: NEGATIVE
Gonorrhea: NEGATIVE

## 2017-06-13 LAB — CBC
HCT: 34.1 % — ABNORMAL LOW (ref 36.0–46.0)
Hemoglobin: 11.7 g/dL — ABNORMAL LOW (ref 12.0–15.0)
MCH: 33 pg (ref 26.0–34.0)
MCHC: 34.3 g/dL (ref 30.0–36.0)
MCV: 96.1 fL (ref 78.0–100.0)
PLATELETS: 173 10*3/uL (ref 150–400)
RBC: 3.55 MIL/uL — ABNORMAL LOW (ref 3.87–5.11)
RDW: 13.9 % (ref 11.5–15.5)
WBC: 23.9 10*3/uL — ABNORMAL HIGH (ref 4.0–10.5)

## 2017-06-13 LAB — OB RESULTS CONSOLE HEPATITIS B SURFACE ANTIGEN: HEP B S AG: NEGATIVE

## 2017-06-13 LAB — OB RESULTS CONSOLE HIV ANTIBODY (ROUTINE TESTING): HIV: NONREACTIVE

## 2017-06-13 LAB — OB RESULTS CONSOLE RUBELLA ANTIBODY, IGM: Rubella: IMMUNE

## 2017-06-13 LAB — OB RESULTS CONSOLE RPR: RPR: NONREACTIVE

## 2017-06-13 LAB — OB RESULTS CONSOLE ANTIBODY SCREEN: ANTIBODY SCREEN: NEGATIVE

## 2017-06-13 LAB — OB RESULTS CONSOLE ABO/RH: RH Type: NEGATIVE

## 2017-06-13 MED ORDER — DIPHENHYDRAMINE HCL 50 MG/ML IJ SOLN: 12.5000 mg | INTRAMUSCULAR | Status: DC | PRN

## 2017-06-13 MED ORDER — ZOLPIDEM TARTRATE 5 MG PO TABS: 5.0000 mg | ORAL_TABLET | Freq: Every evening | ORAL | Status: DC | PRN

## 2017-06-13 MED ORDER — SOD CITRATE-CITRIC ACID 500-334 MG/5ML PO SOLN: 30.0000 mL | ORAL | Status: DC | PRN

## 2017-06-13 MED ORDER — SENNOSIDES-DOCUSATE SODIUM 8.6-50 MG PO TABS
2.0000 | ORAL_TABLET | ORAL | Status: DC
  Administered 2017-06-13 – 2017-06-14 (×2): 2 via ORAL
  Filled 2017-06-13 (×2): qty 2

## 2017-06-13 MED ORDER — TERBUTALINE SULFATE 1 MG/ML IJ SOLN
0.2500 mg | Freq: Once | INTRAMUSCULAR | Status: DC | PRN
  Filled 2017-06-13: qty 1

## 2017-06-13 MED ORDER — OXYCODONE-ACETAMINOPHEN 5-325 MG PO TABS: 1.0000 | ORAL_TABLET | ORAL | Status: DC | PRN

## 2017-06-13 MED ORDER — ONDANSETRON HCL 4 MG PO TABS: 4.0000 mg | ORAL_TABLET | ORAL | Status: DC | PRN

## 2017-06-13 MED ORDER — OXYTOCIN 40 UNITS IN LACTATED RINGERS INFUSION - SIMPLE MED: 2.5000 [IU]/h | INTRAVENOUS | Status: DC

## 2017-06-13 MED ORDER — ACETAMINOPHEN 325 MG PO TABS: 650.0000 mg | ORAL_TABLET | ORAL | Status: DC | PRN

## 2017-06-13 MED ORDER — PRENATAL MULTIVITAMIN CH
1.0000 | ORAL_TABLET | Freq: Every day | ORAL | Status: DC
  Administered 2017-06-14 – 2017-06-15 (×2): 1 via ORAL
  Filled 2017-06-13 (×2): qty 1

## 2017-06-13 MED ORDER — EPHEDRINE 5 MG/ML INJ
10.0000 mg | INTRAVENOUS | Status: DC | PRN
  Filled 2017-06-13: qty 2

## 2017-06-13 MED ORDER — BENZOCAINE-MENTHOL 20-0.5 % EX AERO
1.0000 "application " | INHALATION_SPRAY | CUTANEOUS | Status: DC | PRN
  Administered 2017-06-14: 1 via TOPICAL
  Filled 2017-06-13: qty 56

## 2017-06-13 MED ORDER — LACTATED RINGERS IV SOLN: 500.0000 mL | INTRAVENOUS | Status: DC | PRN

## 2017-06-13 MED ORDER — OXYTOCIN BOLUS FROM INFUSION
500.0000 mL | Freq: Once | INTRAVENOUS | Status: AC
  Administered 2017-06-13: 500 mL via INTRAVENOUS

## 2017-06-13 MED ORDER — IBUPROFEN 600 MG PO TABS
600.0000 mg | ORAL_TABLET | Freq: Four times a day (QID) | ORAL | Status: DC
  Administered 2017-06-13 – 2017-06-15 (×8): 600 mg via ORAL
  Filled 2017-06-13 (×8): qty 1

## 2017-06-13 MED ORDER — DIPHENHYDRAMINE HCL 25 MG PO CAPS: 25.0000 mg | ORAL_CAPSULE | Freq: Four times a day (QID) | ORAL | Status: DC | PRN

## 2017-06-13 MED ORDER — ONDANSETRON HCL 4 MG/2ML IJ SOLN: 4.0000 mg | INTRAMUSCULAR | Status: DC | PRN

## 2017-06-13 MED ORDER — SIMETHICONE 80 MG PO CHEW: 80.0000 mg | CHEWABLE_TABLET | ORAL | Status: DC | PRN

## 2017-06-13 MED ORDER — TETANUS-DIPHTH-ACELL PERTUSSIS 5-2.5-18.5 LF-MCG/0.5 IM SUSP: 0.5000 mL | Freq: Once | INTRAMUSCULAR | Status: DC

## 2017-06-13 MED ORDER — FENTANYL 2.5 MCG/ML BUPIVACAINE 1/10 % EPIDURAL INFUSION (WH - ANES)
14.0000 mL/h | INTRAMUSCULAR | Status: DC | PRN
  Administered 2017-06-13: 14 mL/h via EPIDURAL
  Filled 2017-06-13: qty 100

## 2017-06-13 MED ORDER — COCONUT OIL OIL
1.0000 | TOPICAL_OIL | Status: DC | PRN
Start: 2017-06-13 — End: 2017-06-15

## 2017-06-13 MED ORDER — DIBUCAINE 1 % RE OINT: 1.0000 "application " | TOPICAL_OINTMENT | RECTAL | Status: DC | PRN

## 2017-06-13 MED ORDER — OXYTOCIN 40 UNITS IN LACTATED RINGERS INFUSION - SIMPLE MED
1.0000 m[IU]/min | INTRAVENOUS | Status: DC
  Administered 2017-06-13: 2 m[IU]/min via INTRAVENOUS

## 2017-06-13 MED ORDER — OXYCODONE-ACETAMINOPHEN 5-325 MG PO TABS
1.0000 | ORAL_TABLET | ORAL | Status: DC | PRN
  Administered 2017-06-13: 1 via ORAL

## 2017-06-13 MED ORDER — OXYCODONE-ACETAMINOPHEN 5-325 MG PO TABS
2.0000 | ORAL_TABLET | ORAL | Status: DC | PRN
  Filled 2017-06-13: qty 2

## 2017-06-13 MED ORDER — OXYTOCIN 40 UNITS IN LACTATED RINGERS INFUSION - SIMPLE MED: 2.5000 [IU]/h | INTRAVENOUS | Status: DC | PRN

## 2017-06-13 MED ORDER — SODIUM CHLORIDE 0.9 % IV SOLN
2.0000 g | Freq: Once | INTRAVENOUS | Status: DC
  Filled 2017-06-13: qty 2000

## 2017-06-13 MED ORDER — LACTATED RINGERS IV SOLN: INTRAVENOUS | Status: DC

## 2017-06-13 MED ORDER — LIDOCAINE HCL (PF) 1 % IJ SOLN
INTRAMUSCULAR | Status: DC | PRN
  Administered 2017-06-13 (×2): 5 mL via EPIDURAL

## 2017-06-13 MED ORDER — ONDANSETRON HCL 4 MG/2ML IJ SOLN: 4.0000 mg | Freq: Four times a day (QID) | INTRAMUSCULAR | Status: DC | PRN

## 2017-06-13 MED ORDER — OXYCODONE-ACETAMINOPHEN 5-325 MG PO TABS: 2.0000 | ORAL_TABLET | ORAL | Status: DC | PRN

## 2017-06-13 MED ORDER — PHENYLEPHRINE 40 MCG/ML (10ML) SYRINGE FOR IV PUSH (FOR BLOOD PRESSURE SUPPORT)
80.0000 ug | PREFILLED_SYRINGE | INTRAVENOUS | Status: DC | PRN
  Filled 2017-06-13: qty 5

## 2017-06-13 MED ORDER — WITCH HAZEL-GLYCERIN EX PADS: 1.0000 "application " | MEDICATED_PAD | CUTANEOUS | Status: DC | PRN

## 2017-06-13 MED ORDER — PHENYLEPHRINE 40 MCG/ML (10ML) SYRINGE FOR IV PUSH (FOR BLOOD PRESSURE SUPPORT)
80.0000 ug | PREFILLED_SYRINGE | INTRAVENOUS | Status: DC | PRN
  Filled 2017-06-13: qty 10
  Filled 2017-06-13: qty 5

## 2017-06-13 MED ORDER — OXYTOCIN 40 UNITS IN LACTATED RINGERS INFUSION - SIMPLE MED
INTRAVENOUS | Status: AC
  Filled 2017-06-13: qty 1000

## 2017-06-13 MED ORDER — LIDOCAINE HCL (PF) 1 % IJ SOLN
30.0000 mL | INTRAMUSCULAR | Status: DC | PRN
  Filled 2017-06-13: qty 30

## 2017-06-13 MED ORDER — LACTATED RINGERS IV SOLN: 500.0000 mL | Freq: Once | INTRAVENOUS | Status: DC

## 2017-06-13 NOTE — MAU Note (Signed)
Pt states she began having UCs yesterday am, but states they got stronger around 12a this morning.  Pt reports vag d/c, where she is unsure if bleeding or SROM....describes it as mucousy.  Pt endorse +FM.

## 2017-06-13 NOTE — H&P (Signed)
Heidi Hinton is a 27 y.o. femaleG3P2002 at 37 weeks 6 days  presenting for regular painful contractions. +bloody show, no leaking fluid, notes good fetal movement.  Prenatal Care complicated by: Late Entry to prenatal care at 31 weeks +chlamydia infection - test of cure negative Sickle Cell trait Anemia  RH negative  OB History    Gravida  3   Para  2   Term  2   Preterm  0   AB  0   Living  2     SAB  0   TAB  0   Ectopic  0   Multiple  0   Live Births  2          Past Medical History:  Diagnosis Date  . Anemia   . Late prenatal care   . LSIL (low grade squamous intraepithelial lesion) on Pap smear 03/11/2011  . Rh negative status during pregnancy 04/2011   Past Surgical History:  Procedure Laterality Date  . WISDOM TOOTH EXTRACTION  2009   Family History: family history includes Alcohol abuse in her mother; Alzheimer's disease in her maternal grandmother; Asthma in her brother; Cancer in her brother and maternal grandmother; Mental illness in her maternal grandmother. Social History:  reports that she quit smoking about 18 months ago. She has never used smokeless tobacco. She reports that she does not drink alcohol or use drugs.     Maternal Diabetes: No Genetic Screening: Normal NIPS normal female Maternal Ultrasounds/Referrals: Normal Fetal Ultrasounds or other Referrals:  None Maternal Substance Abuse:  No Significant Maternal Medications:  None Significant Maternal Lab Results:  Lab values include: Group B Strep positive, Rh negative Other Comments:  None  Review of Systems  Constitutional: Negative.  Negative for fever.  Eyes: Negative for blurred vision and double vision.  Cardiovascular: Negative for chest pain.  Gastrointestinal: Negative for heartburn and nausea.  All other systems reviewed and are negative.  Maternal Medical History:  Reason for admission: Contractions.  Nausea.  Contractions: Onset was 3-5 hours ago.    Frequency: regular.   Duration is approximately 60 seconds.   Perceived severity is strong.    Fetal activity: Perceived fetal activity is normal.   Last perceived fetal movement was within the past hour.    Prenatal complications: Infection.   H/o Chlamydia Test of cure negative  Prenatal Complications - Diabetes: none.    Dilation: 8 Effacement (%): 100 Station: -1 Exam by:: Claudette Laws Last menstrual period 09/30/2016. Maternal Exam:  Uterine Assessment: Contraction strength is firm.  Contraction duration is 60 seconds. Contraction frequency is regular.   Abdomen: Patient reports no abdominal tenderness. Fundal height is 38 cm.   Estimated fetal weight is 2760 grams.   Fetal presentation: vertex  Introitus: Normal vulva. Normal vagina.  Ferning test: not done.  Nitrazine test: not done. Amniotic fluid character: not assessed.  Pelvis: adequate for delivery.   Cervix: Cervix evaluated by digital exam.     Physical Exam  Prenatal labs: ABO, Rh:  O negative Antibody:  Pos Rubella:  Immune RPR:   NR HBsAg:   Neg HIV:   Neg GBS:   POS  Assessment/Plan: 27 yo G3P2002 at 37 weeks 6 days in active labor Admit to Labor and Delivery Continuous monitoring IV hydration Epidural on demand Anticipate NSVD   Maverick Patman STACIA 06/13/2017, 12:09 PM

## 2017-06-13 NOTE — Anesthesia Preprocedure Evaluation (Signed)

## 2017-06-13 NOTE — Anesthesia Procedure Notes (Signed)
Epidural Patient location during procedure: OB  Staffing Anesthesiologist: Bali Lyn, MD Performed: anesthesiologist   Preanesthetic Checklist Completed: patient identified, site marked, surgical consent, pre-op evaluation, timeout performed, IV checked, risks and benefits discussed and monitors and equipment checked  Epidural Patient position: sitting Prep: DuraPrep Patient monitoring: heart rate, continuous pulse ox and blood pressure Approach: right paramedian Location: L3-L4 Injection technique: LOR saline  Needle:  Needle type: Tuohy  Needle gauge: 17 G Needle length: 9 cm and 9 Needle insertion depth: 5 cm Catheter type: closed end flexible Catheter size: 20 Guage Catheter at skin depth: 9 cm Test dose: negative  Assessment Events: blood not aspirated, injection not painful, no injection resistance, negative IV test and no paresthesia  Additional Notes Patient identified. Risks/Benefits/Options discussed with patient including but not limited to bleeding, infection, nerve damage, paralysis, failed block, incomplete pain control, headache, blood pressure changes, nausea, vomiting, reactions to medication both or allergic, itching and postpartum back pain. Confirmed with bedside nurse the patient's most recent platelet count. Confirmed with patient that they are not currently taking any anticoagulation, have any bleeding history or any family history of bleeding disorders. Patient expressed understanding and wished to proceed. All questions were answered. Sterile technique was used throughout the entire procedure. Please see nursing notes for vital signs. Test dose was given through epidural needle and negative prior to continuing to dose epidural or start infusion. Warning signs of high block given to the patient including shortness of breath, tingling/numbness in hands, complete motor block, or any concerning symptoms with instructions to call for help. Patient was given  instructions on fall risk and not to get out of bed. All questions and concerns addressed with instructions to call with any issues.     

## 2017-06-14 LAB — CBC
HCT: 32.7 % — ABNORMAL LOW (ref 36.0–46.0)
HEMOGLOBIN: 11.4 g/dL — AB (ref 12.0–15.0)
MCH: 33.2 pg (ref 26.0–34.0)
MCHC: 34.9 g/dL (ref 30.0–36.0)
MCV: 95.3 fL (ref 78.0–100.0)
PLATELETS: 158 10*3/uL (ref 150–400)
RBC: 3.43 MIL/uL — AB (ref 3.87–5.11)
RDW: 13.9 % (ref 11.5–15.5)
WBC: 18.8 10*3/uL — ABNORMAL HIGH (ref 4.0–10.5)

## 2017-06-14 LAB — RPR: RPR Ser Ql: NONREACTIVE

## 2017-06-14 MED ORDER — IBUPROFEN 600 MG PO TABS: 600.0000 mg | ORAL_TABLET | Freq: Four times a day (QID) | ORAL | 0 refills | Status: DC | PRN

## 2017-06-14 NOTE — Lactation Note (Signed)
This note was copied from a baby's chart. Lactation Consultation Note  Patient Name: Heidi Hinton [id number] Date: 06/14/2017 Reason for consult: Initial assessment;Early term 71-38.6wks  Baby is 25 hours old , early term .Marland Kitchen  Per mom 1st and 2nd baby were term and breast fed, and 2nd baby breast fed and pumped.  LC discussed and reviewed potential feeding behavior with an early term infant and gave mom  The "Later preterm guidelines ". Mom mentioned the evening  RN had showed mom how to hand express And spoon feed the baby. And since the baby has not latched has been giving  formula from a bottle But the baby hasn't seemed hungry, and attempted this after noon.  @ this consult after the RN did the assessment - LC assisted mom to hand express, with drops of  Colostrum, latch the baby in the cross cradle on the left due to mom leaking large amount after attempt  On the right . LC switched the baby to the left in the cross cradle assisting mom with multiple swallows,  Increased with breast compressions, baby released after 10 mins, and attempted to re - latch on the right and Baby did not seem interested. Baby STS with mom.   LC stressed to mom since the baby is small, early term infant, it is important to feed with cues and definitely by  3 hours. If it is 3 hours and the baby is sluggish and doesn't latch to try and appetizer of EBM or formula, if still No latch, supplement following the supplementing of an LPT infant.  Also the importance of post pumping both breast after very feeding to enhance milk coming in.  [facility] encouraged hand expressing and pumping due her having such great colostrum flow so early.   Mother informed of post-discharge support and given phone number to the lactation department, including services for phone call assistance; out-patient appointments; and breastfeeding support group. List of other breastfeeding resources in the community given in the handout. Encouraged  mother to call for problems or concerns related to breastfeeding.   Maternal Data Has patient been taught Hand Expression?: Yes Does the patient have breastfeeding experience prior to this delivery?: Yes  Feeding Feeding Type: Breast Fed Length of feed: 10 min(multiple swallows )  LATCH Score Latch: Grasps breast easily, tongue down, lips flanged, rhythmical sucking.  Audible Swallowing: Spontaneous and intermittent  Type of Nipple: Everted at rest and after stimulation  Comfort (Breast/Nipple): Soft / non-tender  Hold (Positioning): Assistance needed to correctly position infant at breast and maintain latch.  LATCH Score: 9  Interventions Interventions: Breast feeding basics reviewed;Assisted with latch;Skin to skin;Breast massage;Hand express;Breast compression;Adjust position;Support pillows;Position options  Lactation Tools Discussed/Used Tools: Pump Breast pump type: Double-Electric Breast Pump WIC Program: Yes Pump Review: Milk Storage(reviewed )   Consult Status Consult Status: Follow-up Date: 06/15/17 Follow-up type: In-patient    Matilde Sprang Cadel Stairs 06/14/2017, 3:46 PM

## 2017-06-14 NOTE — Discharge Instructions (Signed)
Postpartum Care After Vaginal Delivery °The period of time right after you deliver your newborn is called the postpartum period. °What kind of medical care will I receive? °· You may continue to receive fluids and medicines through an IV tube inserted into one of your veins. °· If an incision was made near your vagina (episiotomy) or if you had some vaginal tearing during delivery, cold compresses may be placed on your episiotomy or your tear. This helps to reduce pain and swelling. °· You may be given a squirt bottle to use when you go to the bathroom. You may use this until you are comfortable wiping as usual. To use the squirt bottle, follow these steps: °? Before you urinate, fill the squirt bottle with warm water. Do not use hot water. °? After you urinate, while you are sitting on the toilet, use the squirt bottle to rinse the area around your urethra and vaginal opening. This rinses away any urine and blood. °? You may do this instead of wiping. As you start healing, you may use the squirt bottle before wiping yourself. Make sure to wipe gently. °? Fill the squirt bottle with clean water every time you use the bathroom. °· You will be given sanitary pads to wear. °How can I expect to feel? °· You may not feel the need to urinate for several hours after delivery. °· You will have some soreness and pain in your abdomen and vagina. °· If you are breastfeeding, you may have uterine contractions every time you breastfeed for up to several weeks postpartum. Uterine contractions help your uterus return to its normal size. °· It is normal to have vaginal bleeding (lochia) after delivery. The amount and appearance of lochia is often similar to a menstrual period in the first week after delivery. It will gradually decrease over the next few weeks to a dry, yellow-brown discharge. For most women, lochia stops completely by 6-8 weeks after delivery. Vaginal bleeding can vary from woman to woman. °· Within the first few  days after delivery, you may have breast engorgement. This is when your breasts feel heavy, full, and uncomfortable. Your breasts may also throb and feel hard, tightly stretched, warm, and tender. After this occurs, you may have milk leaking from your breasts. Your health care provider can help you relieve discomfort due to breast engorgement. Breast engorgement should go away within a few days. °· You may feel more sad or worried than normal due to hormonal changes after delivery. These feelings should not last more than a few days. If these feelings do not go away after several days, speak with your health care provider. °How should I care for myself? °· Tell your health care provider if you have pain or discomfort. °· Drink enough water to keep your urine clear or pale yellow. °· Wash your hands thoroughly with soap and water for at least 20 seconds after changing your sanitary pads, after using the toilet, and before holding or feeding your baby. °· If you are not breastfeeding, avoid touching your breasts a lot. Doing this can make your breasts produce more milk. °· If you become weak or lightheaded, or you feel like you might faint, ask for help before: °? Getting out of bed. °? Showering. °· Change your sanitary pads frequently. Watch for any changes in your flow, such as a sudden increase in volume, a change in color, the passing of large blood clots. If you pass a blood clot from your vagina, save it   to show to your health care provider. Do not flush blood clots down the toilet without having your health care provider look at them.  Make sure that all your vaccinations are up to date. This can help protect you and your baby from getting certain diseases. You may need to have immunizations done before you leave the hospital.  If desired, talk with your health care provider about methods of family planning or birth control (contraception). How can I start bonding with my baby? Spending as much time as  possible with your baby is very important. During this time, you and your baby can get to know each other and develop a bond. Having your baby stay with you in your room (rooming in) can give you time to get to know your baby. Rooming in can also help you become comfortable caring for your baby. Breastfeeding can also help you bond with your baby. How can I plan for returning home with my baby?  Make sure that you have a car seat installed in your vehicle. ? Your car seat should be checked by a certified car seat installer to make sure that it is installed safely. ? Make sure that your baby fits into the car seat safely.  Ask your health care provider any questions you have about caring for yourself or your baby. Make sure that you are able to contact your health care provider with any questions after leaving the hospital. This information is not intended to replace advice given to you by your health care provider. Make sure you discuss any questions you have with your health care provider. Document Released: 11/18/2006 Document Revised: 06/26/2015 Document Reviewed: 12/26/2014 Elsevier Interactive Patient Education  2018 [employer].   Postpartum Depression and Baby Blues The postpartum period begins right after the birth of a baby. During this time, there is often a great amount of joy and excitement. It is also a time of many changes in the life of the parents. Regardless of how many times a mother gives birth, each child brings new challenges and dynamics to the family. It is not unusual to have feelings of excitement along with confusing shifts in moods, emotions, and thoughts. All mothers are at risk of developing postpartum depression or the "baby blues." These mood changes can occur right after giving birth, or they may occur many months after giving birth. The baby blues or postpartum depression can be mild or severe. Additionally, postpartum depression can go away rather quickly, or it can  be a long-term condition. What are the causes? Raised hormone levels and the rapid drop in those levels are thought to be a main cause of postpartum depression and the baby blues. A number of hormones change during and after pregnancy. Estrogen and progesterone usually decrease right after the delivery of your baby. The levels of thyroid hormone and various cortisol steroids also rapidly drop. Other factors that play a role in these mood changes include major life events and genetics. What increases the risk? If you have any of the following risks for the baby blues or postpartum depression, know what symptoms to watch out for during the postpartum period. Risk factors that may increase the likelihood of getting the baby blues or postpartum depression include:  Having a personal or family history of depression.  Having depression while being pregnant.  Having premenstrual mood issues or mood issues related to oral contraceptives.  Having a lot of life stress.  Having marital conflict.  Lacking a social  support network.  Having a baby with special needs.  Having health problems, such as diabetes.  What are the signs or symptoms? Symptoms of baby blues include:  Brief changes in mood, such as going from extreme happiness to sadness.  Decreased concentration.  Difficulty sleeping.  Crying spells, tearfulness.  Irritability.  Anxiety.  Symptoms of postpartum depression typically begin within the first month after giving birth. These symptoms include:  Difficulty sleeping or excessive sleepiness.  Marked weight loss.  Agitation.  Feelings of worthlessness.  Lack of interest in activity or food.  Postpartum psychosis is a very serious condition and can be dangerous. Fortunately, it is rare. Displaying any of the following symptoms is cause for immediate medical attention. Symptoms of postpartum psychosis include:  Hallucinations and delusions.  Bizarre or disorganized  behavior.  Confusion or disorientation.  How is this diagnosed? A diagnosis is made by an evaluation of your symptoms. There are no medical or lab tests that lead to a diagnosis, but there are various questionnaires that a health care provider may use to identify those with the baby blues, postpartum depression, or psychosis. Often, a screening tool called the [place] Postnatal Depression Scale is used to diagnose depression in the postpartum period. How is this treated? The baby blues usually goes away on its own in 1-2 weeks. Social support is often all that is needed. You will be encouraged to get adequate sleep and rest. Occasionally, you may be given medicines to help you sleep. Postpartum depression requires treatment because it can last several months or longer if it is not treated. Treatment may include individual or group therapy, medicine, or both to address any social, physiological, and psychological factors that may play a role in the depression. Regular exercise, a healthy diet, rest, and social support may also be strongly recommended. Postpartum psychosis is more serious and needs treatment right away. Hospitalization is often needed. Follow these instructions at home:  Get as much rest as you can. Nap when the baby sleeps.  Exercise regularly. Some women find yoga and walking to be beneficial.  Eat a balanced and nourishing diet.  Do little things that you enjoy. Have a cup of tea, take a bubble bath, read your favorite magazine, or listen to your favorite music.  Avoid alcohol.  Ask for help with household chores, cooking, grocery shopping, or running errands as needed. Do not try to do everything.  Talk to people close to you about how you are feeling. Get support from your partner, family members, friends, or other new moms.  Try to stay positive in how you think. Think about the things you are grateful for.  Do not spend a lot of time alone.  Only take  over-the-counter or prescription medicine as directed by your health care provider.  Keep all your postpartum appointments.  Let your health care provider know if you have any concerns. Contact a health care provider if: You are having a reaction to or problems with your medicine. Get help right away if:  You have suicidal feelings.  You think you may harm the baby or someone else. This information is not intended to replace advice given to you by your health care provider. Make sure you discuss any questions you have with your health care provider. Document Released: 10/26/2003 Document Revised: 06/29/2015 Document Reviewed: 11/02/2012 Elsevier Interactive Patient Education  2017 Elsevier Inc.  Medroxyprogesterone injection [Contraceptive] What is this medicine? MEDROXYPROGESTERONE (me DROX ee proe JES te rone) contraceptive injections prevent  pregnancy. They provide effective birth control for 3 months. Depo-subQ Provera 104 is also used for treating pain related to endometriosis. This medicine may be used for other purposes; ask your health care provider or pharmacist if you have questions. COMMON BRAND NAME(S): Depo-Provera, Depo-subQ Provera 104 What should I tell my health care provider before I take this medicine? They need to know if you have any of these conditions: -frequently drink alcohol -asthma -blood vessel disease or a history of a blood clot in the lungs or legs -bone disease such as osteoporosis -breast cancer -diabetes -eating disorder (anorexia nervosa or bulimia) -high blood pressure -HIV infection or AIDS -kidney disease -liver disease -mental depression -migraine -seizures (convulsions) -stroke -tobacco smoker -vaginal bleeding -an unusual or allergic reaction to medroxyprogesterone, other hormones, medicines, foods, dyes, or preservatives -pregnant or trying to get pregnant -breast-feeding How should I use this medicine? Depo-Provera Contraceptive  injection is given into a muscle. Depo-subQ Provera 104 injection is given under the skin. These injections are given by a health care professional. You must not be pregnant before getting an injection. The injection is usually given during the first 5 days after the start of a menstrual period or 6 weeks after delivery of a baby. Talk to your pediatrician regarding the use of this medicine in children. Special care may be needed. These injections have been used in female children who have started having menstrual periods. Overdosage: If you think you have taken too much of this medicine contact a poison control center or emergency room at once. NOTE: This medicine is only for you. Do not share this medicine with others. What if I miss a dose? Try not to miss a dose. You must get an injection once every 3 months to maintain birth control. If you cannot keep an appointment, call and reschedule it. If you wait longer than 13 weeks between Depo-Provera contraceptive injections or longer than 14 weeks between Depo-subQ Provera 104 injections, you could get pregnant. Use another method for birth control if you miss your appointment. You may also need a pregnancy test before receiving another injection. What may interact with this medicine? Do not take this medicine with any of the following medications: -bosentan This medicine may also interact with the following medications: -aminoglutethimide -antibiotics or medicines for infections, especially rifampin, rifabutin, rifapentine, and griseofulvin -aprepitant -barbiturate medicines such as phenobarbital or primidone -bexarotene -carbamazepine -medicines for seizures like ethotoin, felbamate, oxcarbazepine, phenytoin, topiramate -modafinil -St. John's wort This list may not describe all possible interactions. Give your health care provider a list of all the medicines, herbs, non-prescription drugs, or dietary supplements you use. Also tell them if you  smoke, drink alcohol, or use illegal drugs. Some items may interact with your medicine. What should I watch for while using this medicine? This drug does not protect you against HIV infection (AIDS) or other sexually transmitted diseases. Use of this product may cause you to lose calcium from your bones. Loss of calcium may cause weak bones (osteoporosis). Only use this product for more than 2 years if other forms of birth control are not right for you. The longer you use this product for birth control the more likely you will be at risk for weak bones. Ask your health care professional how you can keep strong bones. You may have a change in bleeding pattern or irregular periods. Many females stop having periods while taking this drug. If you have received your injections on time, your chance of being pregnant  is very low. If you think you may be pregnant, see your health care professional as soon as possible. Tell your health care professional if you want to get pregnant within the next year. The effect of this medicine may last a long time after you get your last injection. What side effects may I notice from receiving this medicine? Side effects that you should report to your doctor or health care professional as soon as possible: -allergic reactions like skin rash, itching or hives, swelling of the face, lips, or tongue -breast tenderness or discharge -breathing problems -changes in vision -depression -feeling faint or lightheaded, falls -fever -pain in the abdomen, chest, groin, or leg -problems with balance, talking, walking -unusually weak or tired -yellowing of the eyes or skin Side effects that usually do not require medical attention (report to your doctor or health care professional if they continue or are bothersome): -acne -fluid retention and swelling -headache -irregular periods, spotting, or absent periods -temporary pain, itching, or skin reaction at site where  injected -weight gain This list may not describe all possible side effects. Call your doctor for medical advice about side effects. You may report side effects to FDA at 1-800-FDA-1088. Where should I keep my medicine? This does not apply. The injection will be given to you by a health care professional. NOTE: This sheet is a summary. It may not cover all possible information. If you have questions about this medicine, talk to your doctor, pharmacist, or health care provider.  2018 Elsevier/Gold Standard (2008-02-12 18:37:56)

## 2017-06-14 NOTE — Clinical Social Work Maternal (Signed)
CLINICAL SOCIAL WORK MATERNAL/CHILD NOTE  Patient Details  Name: Boy Jewel Baize MRN: [id number] Date of Birth: Nov 27, 2018  Date:  February 16, 2017  Clinical Social Worker Initiating Note:  Madilyn Fireman, MSW, LCSW-A Date/Time: Initiated:  06/14/17/1239     Child's Name:  Mai'Jor Dorann Lodge   Biological Parents:  Mother, Father   Need for Interpreter:  None   Reason for Referral:  Current Substance Use/Substance Use During Pregnancy , Late or No Prenatal Care    Address:  [place] [address]    Phone number:  [phone] (home)     Additional phone number:   Household Members/Support Persons (HM/SP):   Household Member/Support Person 1   HM/SP Name Relationship DOB or Age  HM/SP -1 Petros Lehi FOB    HM/SP -2        HM/SP -3        HM/SP -4        HM/SP -5        HM/SP -6        HM/SP -7        HM/SP -8          Natural Supports (not living in the home):  Friends, Extended Family, Immediate Family   Professional Supports: None   Employment: Unemployed   Type of Work:     Education:  Programmer, systems   Homebound arranged:    Museum/gallery curator Resources:  Medicaid   Other Resources:  Physicist, medical , [place] Considerations Which May Impact Care:  None  Strengths:  Ability to meet basic needs , Home prepared for child , Pediatrician chosen   Psychotropic Medications:         Pediatrician:    [place]  Pediatrician List:   [facility] [place]      Pediatrician Fax Number:    Risk Factors/Current Problems:      Cognitive State:  Alert , Able to Concentrate    Mood/Affect:  Comfortable , Calm , Bright    CSW Assessment: CSW met with patient, newborn, FOB Janice Coffin, and 42 year old child of patient, Marsell. CSW obtained permission to discuss reason for consult with family members  present, patient stated agreement. Infant's name is Mai'jor Nurse, learning disability and FOB is [employer]. CSW spoke with patient regarding infant's positive UDS for marijuana, patient stated that she used marijuana recreationally during pregnancy to help with her nausea and poor appetite. CSW educated patient on hospital drug screening policies for patient's with limited prenatal care. Patient stated that she does not have concerns about cord screening or with CSW calling CPS to make notification. CSW and patient discussed patient's pediatric care which will be Dr. Abby Potash at [facility]. Patient has a used car seat for infant, which is not expired. Patient states that the infant will sleep in his crib and bassinet. CSW educated patient on SIDS reduction. Patient is currently unemployed but receives [facility] and Physicist, medical. CSW educated patient on obtaining services for newborn. Patient states she has had limited CPS history in the past, two years ago with the FOB of her two oldest children but no custody removal or termination. Patient reports living at [address] [place], [place], [place]. Patient did not have further questions for CSW, CSW encouraged patient to reach out for assistance if needs  arise before or after discharge.  CSW made report to Wes Early, [place] DSS to notify of substance exposed newborn for marijuana positive UDS. Wes Early stated this report will be accepted but will not require a visit to [facility] from CPS. CSW informed Wes Early that infant and mother likely to be discharged 06/15/17 due to patient currently being 24 hours old. No barriers to discharge at this time, CPS will follow up with mother within 72 hours.  CSW Plan/Description:  Sudden Infant Death Syndrome (SIDS) Education, Perinatal Mood and Anxiety Disorder (PMADs) Education, Hospital Drug Screen Policy Information, Child Protective Service Report , CSW Will Continue to Monitor Umbilical Cord Tissue Drug Screen Results  and Make Report if Warranted    Draycen Leichter L Rosanna Bickle, LCSWA 06/14/2017, 12:56 PM  

## 2017-06-14 NOTE — Discharge Summary (Signed)
OB Discharge Summary     Patient Name: Heidi Hinton DOB: 07/09/1990 MRN: [id number]  Date of admission: 06/13/2017 Delivering MD: Essie Hart   Date of discharge: 06/15/2017  Admitting diagnosis: 37WKS, CTX,PRESSURE Intrauterine pregnancy: [redacted]w[redacted]d     Secondary diagnosis:  Active Problems:   Normal labor  Additional problems:  Sickle cell trait Anemia     Discharge diagnosis: Term Pregnancy Delivered                                                                                                Post partum procedures:N/A  Augmentation: AROM  Complications: None  Hospital course:  Onset of Labor With Vaginal Delivery     27 y.o. yo G3P3003 at [redacted]w[redacted]d was admitted in Active Labor on 06/13/2017. Patient had an uncomplicated labor course as follows:  Membrane Rupture Time/Date: 1:04 PM ,06/13/2017   Intrapartum Procedures: Episiotomy: None [1]                                         Lacerations:  None [1]  Patient had a delivery of a Viable infant. 06/13/2017  Information for the patient's newborn:  Era, Parr [[id number]]  Delivery Method: Vaginal, Spontaneous(Filed from Delivery Summary)    Pateint had an uncomplicated postpartum course.  She is ambulating, tolerating a regular diet, passing flatus, and urinating well. Patient is discharged home in stable condition on 06/15/17.   Physical exam  Vitals:   06/14/17 0500 06/14/17 1804 06/14/17 2040 06/15/17 0537  BP: 116/67 (!) 113/54 105/60 116/75  Pulse: 67 62 69 60  Resp: Temp: 98.2 F (36.8 C) 98.8 F (37.1 C) 98.6 F (37 C) 98.7 F (37.1 C)  TempSrc:  Oral Oral Oral  SpO2: 100%  100% 100%  Weight:      Height:       General: alert, cooperative and no distress Lochia: appropriate Uterine Fundus: firm Incision: N/A DVT Evaluation: No evidence of DVT seen on physical exam. Negative Homan's sign. No cords or calf tenderness. No significant calf/ankle edema.  Results for orders  placed or performed during the hospital encounter of 06/13/17 (from the past 48 hour(s))  CBC     Status: Abnormal   Collection Time: 06/13/17 12:30 PM  Result Value Ref Range   WBC 23.9 (H) 4.0 - 10.5 K/uL   RBC 3.55 (L) 3.87 - 5.11 MIL/uL   Hemoglobin 11.7 (L) 12.0 - 15.0 g/dL   HCT 56.2 (L) 13.0 - 86.5 %   MCV 96.1 78.0 - 100.0 fL   MCH 33.0 26.0 - 34.0 pg   MCHC 34.3 30.0 - 36.0 g/dL   RDW 78.4 69.6 - 29.5 %   Platelets 173 150 - 400 K/uL    Comment: Performed at [facility], [address] [place], [place] [address]  Type and screen [facility] [place]     Status: None (Preliminary result)   Collection Time: 06/13/17 12:30 PM  Result Value Ref Range  ABO/RH(D) O NEG    Antibody Screen POS    Sample Expiration 06/16/2017    Antibody Identification      PASSIVELY ACQUIRED ANTI-D Performed at [facility], [address] [place], [place] [address]    Unit Number [id number]    Blood Component Type RED CELLS,LR    Unit division 00    Status of Unit ALLOCATED    Transfusion Status OK TO TRANSFUSE    Crossmatch Result COMPATIBLE    Unit Number [id number]    Blood Component Type RED CELLS,LR    Unit division 00    Status of Unit ALLOCATED    Transfusion Status OK TO TRANSFUSE    Crossmatch Result COMPATIBLE   RPR     Status: None   Collection Time: 06/13/17 12:30 PM  Result Value Ref Range   RPR Ser Ql Non Reactive Non Reactive    Comment: (NOTE) Performed At: [facility] [address] [place], [place] [id number] Jolene Schimke MD MW:[id number] Performed at [facility], [address], [place], [place] [address]   CBC     Status: Abnormal   Collection Time: 06/14/17  5:56 AM  Result Value Ref Range   WBC 18.8 (H) 4.0 - 10.5 K/uL   RBC 3.43 (L) 3.87 - 5.11 MIL/uL   Hemoglobin 11.4 (L) 12.0 - 15.0 g/dL   HCT 64.4 (L) 03.4 - 74.2 %   MCV 95.3 78.0 - 100.0 fL   MCH 33.2 26.0 - 34.0 pg   MCHC 34.9 30.0 - 36.0 g/dL   RDW 59.5  63.8 - 75.6 %   Platelets 158 150 - 400 K/uL    Comment: Performed at [facility], [address], [place], [place] [address]   Discharge instruction: per After Visit Summary and "Baby and Me Booklet".  After visit meds:  Allergies as of 06/15/2017   No Known Allergies     Medication List    TAKE these medications   ibuprofen 600 MG tablet Commonly known as:  ADVIL,MOTRIN Take 1 tablet (600 mg total) by mouth every 6 (six) hours as needed for mild pain, moderate pain or cramping.   prenatal multivitamin Tabs tablet Take 1 tablet by mouth daily at 12 noon.       Diet: routine diet  Activity: Advance as tolerated. Pelvic rest for 6 weeks.   Outpatient follow up:6 weeks Follow up Appt:No future appointments. Follow up Visit:No follow-ups on file.  Postpartum contraception: Depo Provera  Newborn Data: Live born female  Birth Weight: 6 lb 3.7 oz (2825 g) APGAR: 9, 9  Newborn Delivery   Birth date/time:  06/13/2017 14:04:00 Delivery type:  Vaginal, Spontaneous     Baby Feeding: Bottle and Breast Disposition:home with mother   06/15/2017 Janeece Riggers, CNM

## 2017-06-14 NOTE — Progress Notes (Signed)
Post Partum Day 1 Subjective: no complaints and up ad lib  Objective: Vitals:   06/13/17 1600 06/13/17 1654 06/13/17 2015 06/14/17 0500  BP: 127/72 128/68 125/66 116/67  Pulse: (!) 59 64 72 67  Resp:   18 18  Temp: 98.4 F (36.9 C)  98.8 F (37.1 C) 98.2 F (36.8 C)  TempSrc: Oral  Oral   SpO2:   100% 100%  Weight:      Height:        Physical Exam:  General: alert and cooperative Lochia: appropriate Uterine Fundus: firm Incision: n/a DVT Evaluation: No evidence of DVT seen on physical exam. Negative Homan's sign. No cords or calf tenderness. No significant calf/ankle edema.  Results for orders placed or performed during the hospital encounter of 06/13/17 (from the past 24 hour(s))  CBC     Status: Abnormal   Collection Time: 06/13/17 12:30 PM  Result Value Ref Range   WBC 23.9 (H) 4.0 - 10.5 K/uL   RBC 3.55 (L) 3.87 - 5.11 MIL/uL   Hemoglobin 11.7 (L) 12.0 - 15.0 g/dL   HCT 16.1 (L) 09.6 - 04.5 %   MCV 96.1 78.0 - 100.0 fL   MCH 33.0 26.0 - 34.0 pg   MCHC 34.3 30.0 - 36.0 g/dL   RDW 40.9 81.1 - 91.4 %   Platelets 173 150 - 400 K/uL  Type and screen [facility] [place]     Status: None (Preliminary result)   Collection Time: 06/13/17 12:30 PM  Result Value Ref Range   ABO/RH(D) O NEG    Antibody Screen POS    Sample Expiration 06/16/2017    Antibody Identification      PASSIVELY ACQUIRED ANTI-D Performed at [facility], [address], [place], [place] [address]    Unit Number [id number]    Blood Component Type RED CELLS,LR    Unit division 00    Status of Unit ALLOCATED    Transfusion Status OK TO TRANSFUSE    Crossmatch Result COMPATIBLE    Unit Number [id number]    Blood Component Type RED CELLS,LR    Unit division 00    Status of Unit ALLOCATED    Transfusion Status OK TO TRANSFUSE    Crossmatch Result COMPATIBLE   RPR     Status: None   Collection Time: 06/13/17 12:30 PM  Result Value Ref Range   RPR Ser Ql Non Reactive  Non Reactive   Assessment/Plan: Plan for discharge tomorrow and Breastfeeding  Circumcision at office outpatient   LOS: 1 day   Janeece Riggers 06/14/2017, 5:40 AM

## 2017-06-14 NOTE — Anesthesia Postprocedure Evaluation (Signed)
Anesthesia Post Note  Patient: Heidi Hinton  Procedure(s) Performed: AN AD HOC LABOR EPIDURAL     Patient location during evaluation: Mother Baby Anesthesia Type: Epidural Level of consciousness: awake and alert and oriented Pain management: satisfactory to patient Vital Signs Assessment: post-procedure vital signs reviewed and stable Respiratory status: spontaneous breathing and nonlabored ventilation Cardiovascular status: stable Postop Assessment: no headache, no backache, no signs of nausea or vomiting, adequate PO intake, patient able to bend at knees and able to ambulate (patient up walking) Anesthetic complications: no    Last Vitals:  Vitals:   06/13/17 2015 06/14/17 0500  BP: 125/66 116/67  Pulse: 72 67  Resp: 18 18  Temp: 37.1 C 36.8 C  SpO2: 100% 100%    Last Pain:  Vitals:   06/14/17 0500  TempSrc:   PainSc: 5    Pain Goal:                 Madison Hickman

## 2017-06-15 ENCOUNTER — Ambulatory Visit: Payer: Self-pay

## 2017-06-15 NOTE — Lactation Note (Signed)
This note was copied from a baby's chart. Lactation Consultation Note  Patient Name: Heidi Hinton [id number] Date: 06/15/2017   [facility] Follow Up Visit:   Mother has been breastfeeding and states that she has been leaking and infant latches well.  He has been feeding between 5-10 minutes at a time.  She has only pumped one time today.  Due to infant being an ETI and < 6 lbs I suggested she breastfeed and post pump after every feeding throughout the night.  Mother has a curved tip syringe at bedside which she used once and did not like.  She also has used a bottle nipple.  I demonstrated a foley cup for her and she seems to like this.  She stated she will pump after every feed tonight and supplement with any EBM she obtains using the cup.  Encouraged her to call for assistance as needed with the cup.  Also reminded mother to awaken baby at the 3 hour interval if he has not self awakened.  I stressed the importance of a lot of feedings limiting times to 30 minutes or less and resting in between.  Mother verbalized understanding.  Her infant remains on phototherapy via the bili blanket and mother has experience with phototherapy.    No further questions/concerns at this time.  Call as needed for assistance.               Chrsitopher Wik R Heidi Hinton 06/15/2017, 9:28 PM

## 2017-06-16 ENCOUNTER — Ambulatory Visit: Payer: Self-pay

## 2017-06-16 NOTE — Lactation Note (Signed)
This note was copied from a baby's chart. Lactation Consultation Note  Patient Name: Boy [place] Patton [id number] Date: 06/16/2017 Reason for consult: Follow-up assessment;Early term 37-38.6wks;Infant < 6lbs Baby is now 56 hours old and at a 9% weight loss.  Phototherapy is discontinued.  This is mom's third baby and she breastfed her previous babies.  Mom states breasts are full.  No engorgement.  Mom reports baby is latching well and feeding without problems.  Mom has a DEBP at home.  Instructed to give any expressed milk back to baby.  Lactation services and support reviewed and encouraged prn.  Maternal Data    Feeding Feeding Type: Bottle Fed - Breast Milk Nipple Type: Slow - flow  LATCH Score                   Interventions    Lactation Tools Discussed/Used     Consult Status Consult Status: Complete Follow-up type: Call as needed    Huston Foley 06/16/2017, 10:25 AM

## 2017-06-17 LAB — BPAM RBC
BLOOD PRODUCT EXPIRATION DATE: 201905312359
Blood Product Expiration Date: 201905272359
Unit Type and Rh: 9500
Unit Type and Rh: 9500

## 2017-06-17 LAB — TYPE AND SCREEN
ABO/RH(D): O NEG
ANTIBODY SCREEN: POSITIVE
UNIT DIVISION: 0
Unit division: 0

## 2017-08-08 ENCOUNTER — Encounter (HOSPITAL_COMMUNITY): Payer: Self-pay | Admitting: *Deleted

## 2017-08-08 ENCOUNTER — Ambulatory Visit (HOSPITAL_COMMUNITY)
Admission: EM | Admit: 2017-08-08 | Discharge: 2017-08-08 | Disposition: A | Discharge status: Home / Self Care | Payer: Medicaid Other | Attending: Family Medicine | Admitting: Family Medicine

## 2017-08-08 ENCOUNTER — Other Ambulatory Visit: Payer: Self-pay

## 2017-08-08 DIAGNOSIS — K047 Periapical abscess without sinus: Secondary | ICD-10-CM | POA: Diagnosis not present

## 2017-08-08 MED ORDER — AMOXICILLIN 875 MG PO TABS: 875.0000 mg | ORAL_TABLET | Freq: Two times a day (BID) | ORAL | 1 refills | Status: DC

## 2017-08-08 NOTE — Discharge Instructions (Signed)
Take antibiotic as directed.  Follow-up with Dr. Jeanice Limurham or his associate.

## 2017-08-08 NOTE — ED Provider Notes (Signed)
[facility]    CSN: [id number] Arrival date & time: 08/08/17  1051     History   Chief Complaint Chief Complaint  Patient presents with  . Oral Swelling    HPI Heidi Hinton is a 27 y.o. female.   HPI  Patient has swelling and pain on the left side of her face.  Is associated with a tooth that was pulled.  No fever or chills.  No nausea or vomiting.  She does not have a dentist to return to for this.  She is otherwise well.  She has a baby born in May.  She is trying to breast-feed.  Past Medical History:  Diagnosis Date  . Anemia   . Late prenatal care   . LSIL (low grade squamous intraepithelial lesion) on Pap smear 03/11/2011  . Rh negative status during pregnancy 04/2011    Patient Active Problem List   Diagnosis Date Noted  . Normal labor 06/13/2017  . NVD (normal vaginal delivery) 06/22/2013  . Marijuana use 06/17/2013  . Group B Streptococcus carrier, +RV culture, currently pregnant 06/14/2013  . Maternal thrombocytopenia complicating pregnancy in third trimester 06/22/2011  . Late prenatal care 05/22/2011  . Rh negative status during pregnancy 05/22/2011    Past Surgical History:  Procedure Laterality Date  . WISDOM TOOTH EXTRACTION  2009    OB History    Gravida  3   Para  3   Term  3   Preterm  0   AB  0   Living  3     SAB  0   TAB  0   Ectopic  0   Multiple  0   Live Births  3            Home Medications    Prior to Admission medications   Medication Sig Start Date End Date Taking? Authorizing Provider  amoxicillin (AMOXIL) 875 MG tablet Take 1 tablet (875 mg total) by mouth 2 (two) times daily. 08/08/17   Eustace MooreNelson, Yvonne Sue, MD  ibuprofen (ADVIL,MOTRIN) 600 MG tablet Take 1 tablet (600 mg total) by mouth every 6 (six) hours as needed for mild pain, moderate pain or cramping. 06/14/17   Janeece RiggersGreer, Ellis K, CNM    Family History Family History  Problem Relation Age of Onset  . Alcohol abuse Mother   . Asthma  Brother   . Cancer Brother        osteosarcoma  . Cancer Maternal Grandmother   . Mental illness Maternal Grandmother   . Alzheimer's disease Maternal Grandmother   . Anesthesia problems Neg Hx   . Hypotension Neg Hx   . Malignant hyperthermia Neg Hx   . Pseudochol deficiency Neg Hx     Social History Social History   Tobacco Use  . Smoking status: Former Smoker    Last attempt to quit: 12/07/2015    Years since quitting: 1.6  . Smokeless tobacco: Never Used  Substance Use Topics  . Alcohol use: No  . Drug use: No     Allergies   Patient has no known allergies.   Review of Systems Review of Systems  Constitutional: Negative for chills and fever.  HENT: Positive for dental problem. Negative for ear pain and sore throat.   Eyes: Negative for pain and visual disturbance.  Respiratory: Negative for cough and shortness of breath.   Cardiovascular: Negative for chest pain and palpitations.  Gastrointestinal: Negative for abdominal pain and vomiting.  Genitourinary: Negative for dysuria  and hematuria.  Musculoskeletal: Negative for arthralgias and back pain.  Skin: Negative for color change and rash.  Neurological: Negative for seizures and syncope.  All other systems reviewed and are negative.    Physical Exam Triage Vital Signs ED Triage Vitals  Enc Vitals Group     BP 08/08/17 1202 (!) 143/92     Pulse Rate 08/08/17 1202 70     Resp 08/08/17 1202 16     Temp 08/08/17 1202 97.6 F (36.4 C)     Temp Source 08/08/17 1202 Oral     SpO2 08/08/17 1202 100 %     Weight --      Height --      Head Circumference --      Peak Flow --      Pain Score 08/08/17 1203 10     Pain Loc --      Pain Edu? --      Excl. in [place]? --    No data found.  Updated Vital Signs BP (!) 143/92 (BP Location: Left Arm)   Pulse 70   Temp 97.6 F (36.4 C) (Oral)   Resp 16   LMP 08/03/2017   SpO2 100%   Visual Acuity Right Eye Distance:   2Left Eye Distance:   Bilateral  Distance:    Right Eye Near:   Left Eye Near:    Bilateral Near:     Physical Exam  Constitutional: She appears well-developed and well-nourished. No distress.  HENT:  Head: Normocephalic and atraumatic.  Mouth/Throat: Oropharynx is clear and moist.    Eyes: Pupils are equal, round, and reactive to light. Conjunctivae are normal.  Neck: Normal range of motion.  Left angle of jaw  Cardiovascular: Normal rate.  Pulmonary/Chest: Effort normal. No respiratory distress.  Abdominal: Soft. She exhibits no distension.  Musculoskeletal: Normal range of motion. She exhibits no edema.  Lymphadenopathy:    She has cervical adenopathy.  Neurological: She is alert.  Skin: Skin is warm and dry.     UC Treatments / Results  Labs (all labs ordered are listed, but only abnormal results are displayed) Labs Reviewed - No data to display  EKG None  Radiology No results found.  Procedures Procedures (including critical care time)  Medications Ordered in UC Medications - No data to display  Initial Impression / Assessment and Plan / UC Course  I have reviewed the triage vital signs and the nursing notes.  Pertinent labs & imaging results that were available during my care of the patient were reviewed by me and considered in my medical decision making (see chart for details).      Final Clinical Impressions(s) / UC Diagnoses   Final diagnoses:  Dental infection     Discharge Instructions     Take antibiotic as directed.  Follow-up with Dr. Jeanice Lim or his associate.    ED Prescriptions    Medication Sig Dispense Auth. Provider   amoxicillin (AMOXIL) 875 MG tablet Take 1 tablet (875 mg total) by mouth 2 (two) times daily. 20 tablet Eustace Moore, MD     Controlled Substance Prescriptions [place] Controlled Substance Registry consulted? Not Applicable   Eustace Moore, MD 08/08/17 1233

## 2017-08-08 NOTE — ED Triage Notes (Signed)
Swelling to gum on the left side.

## 2018-11-21 ENCOUNTER — Telehealth: Payer: Medicaid Other | Admitting: Family

## 2018-11-21 DIAGNOSIS — M549 Dorsalgia, unspecified: Secondary | ICD-10-CM

## 2018-11-21 NOTE — Progress Notes (Signed)
Based on what you shared with me, I feel your condition warrants further evaluation and I recommend that you be seen for a face to face office visit.  Unfortunately we cannot prescribe Rhogam through e-visit. You will have to see your primary care or OB/GYN  NOTE: If you entered your credit card information for this eVisit, you will not be charged. You may see a "hold" on your card for the $35 but that hold will drop off and you will not have a charge processed.  If you are having a true medical emergency please call 911.     For an urgent face to face visit, [place] has four urgent care centers for your convenience:   . [facility]    [phone]                  Get Driving Directions  [address], [place] [address] . 10 am to 8 pm Monday-Friday . 12 pm to 8 pm Saturday-Sunday   . [facility] [place]                  Get Driving Directions  [address], [place], [place] [address] . 8 am to 8 pm Monday-Friday . 9 am to 6 pm Saturday . 11 am to 6 pm Sunday   . [facility] at [place]                  Get Driving Directions   [address], [place] [address] . 8 am to 8 pm Monday-Friday . 8 am to 4 pm Saturday-Sunday    . [facility] at [place]                    Get Driving Directions  [phone]  [address], [place], [place] [address]  . Monday-Friday, 12 PM to 6 PM    Your e-visit answers were reviewed by a board certified advanced clinical practitioner to complete your personal care plan.  Thank you for using e-Visits.

## 2021-01-24 ENCOUNTER — Other Ambulatory Visit: Payer: Self-pay

## 2021-01-24 ENCOUNTER — Encounter (HOSPITAL_COMMUNITY): Payer: Self-pay | Admitting: Emergency Medicine

## 2021-01-24 ENCOUNTER — Ambulatory Visit (HOSPITAL_COMMUNITY)
Admission: EM | Admit: 2021-01-24 | Discharge: 2021-01-24 | Disposition: A | Discharge status: Home / Self Care | Payer: Medicaid Other | Attending: Family Medicine | Admitting: Family Medicine

## 2021-01-24 DIAGNOSIS — R220 Localized swelling, mass and lump, head: Secondary | ICD-10-CM

## 2021-01-24 DIAGNOSIS — K047 Periapical abscess without sinus: Secondary | ICD-10-CM

## 2021-01-24 MED ORDER — AMOXICILLIN-POT CLAVULANATE 875-125 MG PO TABS: 1.0000 | ORAL_TABLET | Freq: Two times a day (BID) | ORAL | 0 refills | Status: DC

## 2021-01-24 MED ORDER — NAPROXEN 500 MG PO TABS: 500.0000 mg | ORAL_TABLET | Freq: Two times a day (BID) | ORAL | 0 refills | Status: DC

## 2021-01-24 MED ORDER — IBUPROFEN 800 MG PO TABS
ORAL_TABLET | ORAL | Status: AC
  Filled 2021-01-24: qty 1

## 2021-01-24 MED ORDER — IBUPROFEN 800 MG PO TABS
800.0000 mg | ORAL_TABLET | Freq: Once | ORAL | Status: AC
  Administered 2021-01-24: 18:00:00 800 mg via ORAL

## 2021-01-24 NOTE — ED Triage Notes (Signed)
Patient c/o dental pain x 1 week.   Patient endorses "top left corner of mouth" pain.   Patient denies any discharge.   Patient endorses swelling.   Patient has taken Tylenol with no relief of symptoms.

## 2021-01-24 NOTE — ED Provider Notes (Signed)
[facility]    CSN: [id number] Arrival date & time: 01/24/21  1613      History   Chief Complaint Chief Complaint  Patient presents with   Dental Pain    HPI Heidi Hinton is a 30 y.o. female.   HPI Patient presents for evaluation of dental pain.  Pain involving left corner of mouth top with pain. Pain present for total of one week. Past Medical History:  Diagnosis Date   Anemia    Late prenatal care    LSIL (low grade squamous intraepithelial lesion) on Pap smear 03/11/2011   Rh negative status during pregnancy 04/2011    Patient Active Problem List   Diagnosis Date Noted   Normal labor 06/13/2017   NVD (normal vaginal delivery) 06/22/2013   Marijuana use 06/17/2013   Group B Streptococcus carrier, +RV culture, currently pregnant 06/14/2013   Maternal thrombocytopenia complicating pregnancy in third trimester 06/22/2011   Late prenatal care 05/22/2011   Rh negative status during pregnancy 05/22/2011    Past Surgical History:  Procedure Laterality Date   WISDOM TOOTH EXTRACTION  2009    OB History     Gravida  3   Para  3   Term  3   Preterm  0   AB  0   Living  3      SAB  0   IAB  0   Ectopic  0   Multiple  0   Live Births  3            Home Medications    Prior to Admission medications   Medication Sig Start Date End Date Taking? Authorizing Provider  amoxicillin-clavulanate (AUGMENTIN) 875-125 MG tablet Take 1 tablet by mouth 2 (two) times daily. 01/24/21  Yes Bing Neighbors, FNP  naproxen (NAPROSYN) 500 MG tablet Take 1 tablet (500 mg total) by mouth 2 (two) times daily. 01/24/21  Yes Bing Neighbors, FNP  ibuprofen (ADVIL,MOTRIN) 600 MG tablet Take 1 tablet (600 mg total) by mouth every 6 (six) hours as needed for mild pain, moderate pain or cramping. 06/14/17   Janeece Riggers, CNM    Family History Family History  Problem Relation Age of Onset   Alcohol abuse Mother    Asthma Brother    Cancer  Brother        osteosarcoma   Cancer Maternal Grandmother    Mental illness Maternal Grandmother    Alzheimer's disease Maternal Grandmother    Anesthesia problems Neg Hx    Hypotension Neg Hx    Malignant hyperthermia Neg Hx    Pseudochol deficiency Neg Hx     Social History Social History   Tobacco Use   Smoking status: Former    Types: Cigarettes    Quit date: 12/07/2015    Years since quitting: 5.1   Smokeless tobacco: Never  Substance Use Topics   Alcohol use: No   Drug use: No     Allergies   Patient has no known allergies.   Review of Systems Review of Systems Pertinent negatives listed in HPI  Physical Exam Triage Vital Signs ED Triage Vitals  Enc Vitals Group     BP 01/24/21 1723 111/66     Pulse Rate 01/24/21 1723 69     Resp 01/24/21 1723 16     Temp 01/24/21 1723 99.2 F (37.3 C)     Temp Source 01/24/21 1723 Oral     SpO2 01/24/21 1723 98 %  Weight --      Height --      Head Circumference --      Peak Flow --      Pain Score 01/24/21 1726 10     Pain Loc --      Pain Edu? --      Excl. in [place]? --    No data found.  Updated Vital Signs BP 111/66 (BP Location: Right Arm)    Pulse 69    Temp 99.2 F (37.3 C) (Oral)    Resp 16    LMP 01/24/2021 (Exact Date)    SpO2 98%    Breastfeeding No   Visual Acuity Right Eye Distance:   Left Eye Distance:   Bilateral Distance:    Right Eye Near:   Left Eye Near:    Bilateral Near:     Physical Exam Constitutional:      Appearance: Normal appearance.  HENT:     Head: Normocephalic and atraumatic.     Mouth/Throat:     Dentition: Abnormal dentition. Dental tenderness, gingival swelling and dental caries present.  Eyes:     Extraocular Movements: Extraocular movements intact.     Pupils: Pupils are equal, round, and reactive to light.  Cardiovascular:     Rate and Rhythm: Normal rate and regular rhythm.  Pulmonary:     Effort: Pulmonary effort is normal.     Breath sounds: Normal breath  sounds.  Skin:    Capillary Refill: Capillary refill takes less than 2 seconds.  Neurological:     General: No focal deficit present.     Mental Status: She is alert.     UC Treatments / Results  Labs (all labs ordered are listed, but only abnormal results are displayed) Labs Reviewed - No data to display  EKG   Radiology No results found.  Procedures Procedures (including critical care time)  Medications Ordered in UC Medications - No data to display  Initial Impression / Assessment and Plan / UC Course  I have reviewed the triage vital signs and the nursing notes.  Pertinent labs & imaging results that were available during my care of the patient were reviewed by me and considered in my medical decision making (see chart for details).    Start Augmentin twice daily for 10 days for dental infection.  Naprosyn 500 mg twice daily as needed for tooth pain.  Advised to follow-up with [facility] for further evaluation and management of dental problems  Final Clinical Impressions(s) / UC Diagnoses   Final diagnoses:  Dental infection  Gingival swelling   Discharge Instructions   None    ED Prescriptions     Medication Sig Dispense Auth. Provider   amoxicillin-clavulanate (AUGMENTIN) 875-125 MG tablet Take 1 tablet by mouth 2 (two) times daily. 20 tablet Scot Jun, FNP   naproxen (NAPROSYN) 500 MG tablet Take 1 tablet (500 mg total) by mouth 2 (two) times daily. 30 tablet Scot Jun, FNP      PDMP not reviewed this encounter.   Scot Jun, FNP 01/24/21 [phone]

## 2022-11-07 ENCOUNTER — Inpatient Hospital Stay (HOSPITAL_COMMUNITY): Payer: 59

## 2022-11-07 ENCOUNTER — Encounter (HOSPITAL_COMMUNITY): Payer: Self-pay | Admitting: Family Medicine

## 2022-11-07 ENCOUNTER — Inpatient Hospital Stay (HOSPITAL_COMMUNITY)
Admission: AD | Admit: 2022-11-07 | Discharge: 2022-11-07 | Disposition: A | Discharge status: Home / Self Care | Payer: 59 | Attending: Family Medicine | Admitting: Family Medicine

## 2022-11-07 ENCOUNTER — Telehealth: Payer: Self-pay | Admitting: Certified Nurse Midwife

## 2022-11-07 DIAGNOSIS — Z3A11 11 weeks gestation of pregnancy: Secondary | ICD-10-CM | POA: Diagnosis not present

## 2022-11-07 DIAGNOSIS — O26891 Other specified pregnancy related conditions, first trimester: Secondary | ICD-10-CM | POA: Diagnosis not present

## 2022-11-07 DIAGNOSIS — R109 Unspecified abdominal pain: Secondary | ICD-10-CM | POA: Diagnosis not present

## 2022-11-07 DIAGNOSIS — O98311 Other infections with a predominantly sexual mode of transmission complicating pregnancy, first trimester: Secondary | ICD-10-CM | POA: Diagnosis not present

## 2022-11-07 DIAGNOSIS — N939 Abnormal uterine and vaginal bleeding, unspecified: Secondary | ICD-10-CM | POA: Diagnosis not present

## 2022-11-07 DIAGNOSIS — O208 Other hemorrhage in early pregnancy: Secondary | ICD-10-CM | POA: Diagnosis present

## 2022-11-07 DIAGNOSIS — A599 Trichomoniasis, unspecified: Secondary | ICD-10-CM | POA: Diagnosis not present

## 2022-11-07 LAB — CBC
HCT: 33.2 % — ABNORMAL LOW (ref 36.0–46.0)
Hemoglobin: 11.7 g/dL — ABNORMAL LOW (ref 12.0–15.0)
MCH: 32.1 pg (ref 26.0–34.0)
MCHC: 35.2 g/dL (ref 30.0–36.0)
MCV: 91 fL (ref 80.0–100.0)
Platelets: 228 10*3/uL (ref 150–400)
RBC: 3.65 MIL/uL — ABNORMAL LOW (ref 3.87–5.11)
RDW: 14.4 % (ref 11.5–15.5)
WBC: 8.9 10*3/uL (ref 4.0–10.5)
nRBC: 0 % (ref 0.0–0.2)

## 2022-11-07 LAB — URINALYSIS, ROUTINE W REFLEX MICROSCOPIC
Bilirubin Urine: NEGATIVE
Glucose, UA: NEGATIVE mg/dL
Ketones, ur: NEGATIVE mg/dL
Nitrite: NEGATIVE
Protein, ur: NEGATIVE mg/dL
RBC / HPF: >50 RBC/hpf (ref 0–5)
Specific Gravity, Urine: 1.008 (ref 1.005–1.030)
pH: 8 (ref 5.0–8.0)

## 2022-11-07 LAB — WET PREP, GENITAL
Clue Cells Wet Prep HPF POC: NONE SEEN
Sperm: NONE SEEN
WBC, Wet Prep HPF POC: <10 (ref <10)
Yeast Wet Prep HPF POC: NONE SEEN

## 2022-11-07 LAB — ABO/RH
ABO/RH(D): O NEG
Antibody Screen: NEGATIVE

## 2022-11-07 LAB — POCT PREGNANCY, URINE: Preg Test, Ur: POSITIVE — AB

## 2022-11-07 LAB — HCG, QUANTITATIVE, PREGNANCY: hCG, Beta Chain, Quant, S: 146128 m[IU]/mL — ABNORMAL HIGH (ref <5)

## 2022-11-07 MED ORDER — METRONIDAZOLE 500 MG PO TABS
500.0000 mg | ORAL_TABLET | Freq: Two times a day (BID) | ORAL | 0 refills | Status: DC
Start: 2022-11-07 — End: 2023-11-03

## 2022-11-07 NOTE — MAU Note (Signed)
...  Heidi Hinton is a 32 y.o. at Unknown here in MAU reporting: Heavy vaginal bleeding that began this morning. She reports she passed one golf ball sized blood clot. She reports earlier this morning her pain was severe. Last IC two days ago.  LMP: 09/06/2022 Onset of complaint: This morning Pain score: 7/10 lower abdomen  FHT: n/a Lab orders placed from triage:  POCT Preg, UA

## 2022-11-07 NOTE — Telephone Encounter (Signed)
TC to patient to discuss [facility] and Need for Rhogam. No answer. HIPAA compliant VM left and patient notified via mychart.   Chianti Goh Danella Deis) Suzie Portela, MSN, CNM  Center for [facility]  11/07/2022 2:00 PM

## 2022-11-07 NOTE — Discharge Instructions (Signed)
Prenatal Care Providers           [facility]  [address] [phone]  [facility]   [address]  [phone]  [facility] [place]       [address] [phone]            [facility] @ [place]     [address] [phone]          [facility] [place]   [address] [phone]  [facility]  [address] [phone]     [facility])  [address]   [phone]     [facility]  Phone: [phone]  [facility]  Phone: [phone]  [facility] Phone: [phone]  [facility] Phone: [phone]  [facility] Phone: [phone]  [facility] Phone: [phone]  [facility]  Phone: [phone]  

## 2022-11-07 NOTE — MAU Note (Signed)
Blood bank called after patient had been discharged reporting an RH negative blood type. Dorathy Daft, CNM, made aware.

## 2022-11-07 NOTE — MAU Provider Note (Signed)
History     CSN: [id number]  Arrival date and time: 11/07/22 1003   Event Date/Time   First Provider Initiated Contact with Patient 11/07/22 1128      Chief Complaint  Patient presents with   Abdominal Pain   Heidi Hinton , a  32 y.o. [id number] at [redacted]w[redacted]d presents to MAU with complaints of increased vaginal bleeding that started this morning around 9am.  Patient reports that today she noted heavy vaginal bleeding, enough to wear a pad. She reports that she also passed a large blood clot about the side of a golf ball. She also report intermittent abdominal cramping that is localized to her middle low abdomen that she currently rates a 6/10. She denies attempting to relieve symptoms or worsening or alleviating symptoms. She denies abnormal vaginal discharge or painful urination. She reports last intercourse was a few days ago but reports not pain of bleeding at that time.          OB History     Gravida  4   Para  3   Term  3   Preterm  0   AB  0   Living  3      SAB  0   IAB  0   Ectopic  0   Multiple  0   Live Births  3           Past Medical History:  Diagnosis Date   Anemia    Late prenatal care    LSIL (low grade squamous intraepithelial lesion) on Pap smear 03/11/2011   Rh negative status during pregnancy 04/2011    Past Surgical History:  Procedure Laterality Date   WISDOM TOOTH EXTRACTION  2009    Family History  Problem Relation Age of Onset   Alcohol abuse Mother    Asthma Brother    Cancer Brother        osteosarcoma   Cancer Maternal Grandmother    Mental illness Maternal Grandmother    Alzheimer's disease Maternal Grandmother    Anesthesia problems Neg Hx    Hypotension Neg Hx    Malignant hyperthermia Neg Hx    Pseudochol deficiency Neg Hx     Social History   Tobacco Use   Smoking status: Former    Current packs/day: 0.00    Types: Cigarettes    Quit date: 12/07/2015    Years since quitting: 6.9   Smokeless tobacco:  Never  Substance Use Topics   Alcohol use: No   Drug use: No    Allergies: No Known Allergies  No medications prior to admission.    Review of Systems  Constitutional:  Negative for chills, fatigue and fever.  Eyes:  Negative for pain and visual disturbance.  Respiratory:  Negative for apnea, shortness of breath and wheezing.   Cardiovascular:  Negative for chest pain and palpitations.  Gastrointestinal:  Positive for abdominal pain. Negative for constipation, diarrhea, nausea and vomiting.  Genitourinary:  Positive for pelvic pain, vaginal bleeding and vaginal discharge. Negative for difficulty urinating, dysuria and vaginal pain.  Musculoskeletal:  Negative for back pain.  Neurological:  Negative for seizures, weakness and headaches.  Psychiatric/Behavioral:  Negative for suicidal ideas.    Physical Exam   Blood pressure 125/74, pulse (!) 55, temperature 98.2 F (36.8 C), temperature source Oral, resp. rate 16, height 5\' 8"  (1.727 m), weight 55 kg, last menstrual period 09/06/2022, SpO2 100%.  Physical Exam Vitals and nursing note reviewed.  Constitutional:  General: She is not in acute distress.    Appearance: Normal appearance.  HENT:     Head: Normocephalic.  Pulmonary:     Effort: Pulmonary effort is normal.  Musculoskeletal:     Cervical back: Normal range of motion.  Skin:    General: Skin is warm and dry.  Neurological:     Mental Status: She is alert and oriented to person, place, and time.  Psychiatric:        Mood and Affect: Mood normal.      MAU Course  Procedures Orders Placed This Encounter  Procedures   Wet prep, genital   US OB Comp Less 14 Wks   CBC   hCG, quantitative, pregnancy   Urinalysis, Routine w reflex microscopic -Urine, Clean Catch   Pregnancy, urine POC   ABO/Rh   Discharge patient   Results for orders placed or performed during the hospital encounter of 11/07/22 (from the past 24 hour(s))  Wet prep, genital     Status:  Abnormal   Collection Time: 11/07/22 10:21 AM   Specimen: PATH Cytology Cervicovaginal Ancillary Only  Result Value Ref Range   Yeast Wet Prep HPF POC NONE SEEN NONE SEEN   Trich, Wet Prep PRESENT (A) NONE SEEN   Clue Cells Wet Prep HPF POC NONE SEEN NONE SEEN   WBC, Wet Prep HPF POC <10 <10   Sperm NONE SEEN   Pregnancy, urine POC     Status: Abnormal   Collection Time: 11/07/22 10:36 AM  Result Value Ref Range   Preg Test, Ur POSITIVE (A) NEGATIVE  ABO/Rh     Status: None   Collection Time: 11/07/22 10:41 AM  Result Value Ref Range   ABO/RH(D) O NEG    Antibody Screen      NEG Performed at [facility], [address] [place], [place] [address]   CBC     Status: Abnormal   Collection Time: 11/07/22 10:44 AM  Result Value Ref Range   WBC 8.9 4.0 - 10.5 K/uL   RBC 3.65 (L) 3.87 - 5.11 MIL/uL   Hemoglobin 11.7 (L) 12.0 - 15.0 g/dL   HCT 60.4 (L) 54.0 - 98.1 %   MCV 91.0 80.0 - 100.0 fL   MCH 32.1 26.0 - 34.0 pg   MCHC 35.2 30.0 - 36.0 g/dL   RDW 19.1 47.8 - 29.5 %   Platelets 228 150 - 400 K/uL   nRBC 0.0 0.0 - 0.2 %  hCG, quantitative, pregnancy     Status: Abnormal   Collection Time: 11/07/22 10:44 AM  Result Value Ref Range   hCG, Beta Chain, Quant, S 146,128 (H) <5 mIU/mL  Urinalysis, Routine w reflex microscopic -Urine, Clean Catch     Status: Abnormal   Collection Time: 11/07/22 10:45 AM  Result Value Ref Range   Color, Urine STRAW (A) YELLOW   APPearance CLEAR CLEAR   Specific Gravity, Urine 1.008 1.005 - 1.030   pH 8.0 5.0 - 8.0   Glucose, UA NEGATIVE NEGATIVE mg/dL   Hgb urine dipstick LARGE (A) NEGATIVE   Bilirubin Urine NEGATIVE NEGATIVE   Ketones, ur NEGATIVE NEGATIVE mg/dL   Protein, ur NEGATIVE NEGATIVE mg/dL   Nitrite NEGATIVE NEGATIVE   Leukocytes,Ua MODERATE (A) NEGATIVE   RBC / HPF >50 0 - 5 RBC/hpf   WBC, UA 21-50 0 - 5 WBC/hpf   Bacteria, UA RARE (A) NONE SEEN   Squamous Epithelial / HPF 0-5 0 - 5 /HPF   US  OB Comp Less 14  Wks  Result Date: 11/07/2022 CLINICAL DATA:  Vaginal bleeding.  Pregnant EXAM: OBSTETRIC <14 WK ULTRASOUND TECHNIQUE: Transabdominal ultrasound was performed for evaluation of the gestation as well as the maternal uterus and adnexal regions. COMPARISON:  None Available. FINDINGS: Intrauterine gestational sac: Single Yolk sac:  Not Visualized. Embryo:  Visualized. Cardiac Activity: Visualized. Heart Rate: 170 bpm CRL:   46.4 mm   11 w 3 d                  [place] EDC: 05/26/2023 Subchorionic hemorrhage:  Small Maternal uterus/adnexae: Right ovary measures 3.9 x 2.2 by 3.0 cm. Associated small cystic area which appears simple. This measures a proximally 17 mm. Corpus luteum. Opposite ovary measures 3.2 x 2.1 x 1.7 cm. No significant free fluid in the maternal pelvis. IMPRESSION: Single live intrauterine pregnancy of 11 weeks and 3 days with positive fetal heart motion. Trace subchorionic hemorrhage. Electronically Signed   By: Karen Kays M.D.   On: 11/07/2022 12:58    MDM - UA positive for blood, leuks and bacteria. UA reflexed to culture,  - Quant > 146,000 - H&H high normal, patient hemodynamically stable.  - Wet prep positive for Trich - CNM independently reviewed [place] and noted a single living IUP measuring ~11 weeks 3 days.  - plan for discharge   Assessment and Plan   1. Abdominal cramping   2. Trichomoniasis   3. [redacted] weeks gestation of pregnancy   4. Vaginal bleeding    - Reviewed that vaginal infections and STIs like Trich can cause vaginal bleeding and pelvic pain. - Rx sent to outpatient pharmacy for pick up.  - Recommended that patient notify her partner(s) and they receive treatment as well. Also reviewed that patient and other sexual partners remain abstinent or use condoms for the duration of treatment to prevent re-infection.  - Patient verbalized understanding,  - Patient discharged home in stable condition and may return to MAU as needed.  - Reviewed worsening signs and return  precautions.   Claudette Head, MSN CNM  11/07/2022, 11:36 AM   - Patient discharged home prior to ABO and final status of [place] resulting. Rh Negative and patient requires Rhogam injection and patient has a [facility] that could be contributing to vaginal bleeding.  - CNM attempted 2 phone calls to the patient number on file and left 2 HIPAA compliant messages.  - CNM also sent patient a message via MyChart explaining the need for Rhogam and [facility]. CNM plans to attempt to contact patient again tomorrow, and recommend rhogam injection prior to 72 hour window closes.    Zoeann Mol Danella Deis) Suzie Portela, MSN, CNM  Center for [facility]  11/07/2022 9:26 PM

## 2022-11-11 LAB — GC/CHLAMYDIA PROBE AMP (~~LOC~~) NOT AT ARMC
Chlamydia: NEGATIVE
Comment: NEGATIVE
Comment: NORMAL
Neisseria Gonorrhea: NEGATIVE

## 2023-11-03 ENCOUNTER — Telehealth: Admitting: Physician Assistant

## 2023-11-03 DIAGNOSIS — R3989 Other symptoms and signs involving the genitourinary system: Secondary | ICD-10-CM | POA: Diagnosis not present

## 2023-11-03 MED ORDER — CEPHALEXIN 500 MG PO CAPS
500.0000 mg | ORAL_CAPSULE | Freq: Two times a day (BID) | ORAL | 0 refills | Status: AC
Start: 2023-11-03 — End: ?

## 2023-11-03 NOTE — Progress Notes (Signed)
 E-Visit for Urinary Problems  We are sorry that you are not feeling well.  Here is how we plan to help!  Based on what you shared with me it looks like you most likely have a simple urinary tract infection.  A UTI (Urinary Tract Infection) is a bacterial infection of the bladder.  Most cases of urinary tract infections are simple to treat but a key part of your care is to encourage you to drink plenty of fluids and watch your symptoms carefully.  I have prescribed Keflex  500 mg twice a day for 7 days.  Your symptoms should gradually improve. Call us  if the burning in your urine worsens, you develop worsening fever, back pain or pelvic pain or if your symptoms do not resolve after completing the antibiotic.  Urinary tract infections can be prevented by drinking plenty of water to keep your body hydrated.  Also be sure when you wipe, wipe from front to back and don't hold it in!  If possible, empty your bladder every 4 hours.  HOME CARE Drink plenty of fluids Compete the full course of the antibiotics even if the symptoms resolve Remember, when you need to go.go. Holding in your urine can increase the likelihood of getting a UTI! GET HELP RIGHT AWAY IF: You cannot urinate You get a high fever Worsening back pain occurs You see blood in your urine You feel sick to your stomach or throw up You feel like you are going to pass out  MAKE SURE YOU  Understand these instructions. Will watch your condition. Will get help right away if you are not doing well or get worse.   Thank you for choosing an e-visit.  Your e-visit answers were reviewed by a board certified advanced clinical practitioner to complete your personal care plan. Depending upon the condition, your plan could have included both over the counter or prescription medications.  Please review your pharmacy choice. Make sure the pharmacy is open so you can pick up prescription now. If there is a problem, you may contact your  provider through [employer] and have the prescription routed to another pharmacy.  Your safety is important to us . If you have drug allergies check your prescription carefully.   For the next 24 hours you can use MyChart to ask questions about today's visit, request a non-urgent call back, or ask for a work or school excuse. You will get an email in the next two days asking about your experience. I hope that your e-visit has been valuable and will speed your recovery.  I have spent 5 minutes in review of e-visit questionnaire, review and updating patient chart, medical decision making and response to patient.   Delon CHRISTELLA Dickinson, PA-C
